# Patient Record
Sex: Female | Born: 1975 | Race: Black or African American | Hispanic: No | Marital: Single | Smoking: Current every day smoker
Health system: Southern US, Community
[De-identification: ages and names within clinical notes are randomized; demographics above are authoritative.]

## PROBLEM LIST (undated history)

## (undated) DIAGNOSIS — E119 Type 2 diabetes mellitus without complications: Secondary | ICD-10-CM

## (undated) HISTORY — PX: TONSILLECTOMY: SUR1361

---

## 2000-02-08 HISTORY — PX: TUBAL LIGATION: SHX77

## 2013-10-09 ENCOUNTER — Emergency Department: Payer: Self-pay | Admitting: Emergency Medicine

## 2013-10-09 LAB — CBC WITH DIFFERENTIAL/PLATELET
Basophil #: 0 x10 3/mm 3
Basophil %: 0.6 %
Eosinophil #: 0.2 x10 3/mm 3
Eosinophil %: 2.7 %
HCT: 39.6 %
HGB: 12.9 g/dL
Lymphocyte %: 35.2 %
Lymphs Abs: 2.7 x10 3/mm 3
MCH: 29 pg
MCHC: 32.7 g/dL
MCV: 89 fL
Monocyte #: 0.6 "x10 3/mm "
Monocyte %: 7.8 %
Neutrophil #: 4.1 x10 3/mm 3
Neutrophil %: 53.7 %
Platelet: 215 x10 3/mm 3
RBC: 4.46 X10 6/mm 3
RDW: 12.7 %
WBC: 7.6 x10 3/mm 3

## 2013-10-09 LAB — CK TOTAL AND CKMB (NOT AT ARMC)
CK, Total: 142 U/L
CK-MB: 1.2 ng/mL (ref 0.5–3.6)

## 2013-10-09 LAB — COMPREHENSIVE METABOLIC PANEL WITH GFR
Albumin: 3.2 g/dL — ABNORMAL LOW
Alkaline Phosphatase: 61 U/L
Anion Gap: 4 — ABNORMAL LOW
BUN: 9 mg/dL
Bilirubin,Total: 0.8 mg/dL
Calcium, Total: 7.8 mg/dL — ABNORMAL LOW
Chloride: 106 mmol/L
Co2: 28 mmol/L
Creatinine: 0.86 mg/dL
EGFR (African American): >60
EGFR (Non-African Amer.): >60
Glucose: 154 mg/dL — ABNORMAL HIGH
Osmolality: 277
Potassium: 3.7 mmol/L
SGOT(AST): 17 U/L
SGPT (ALT): 15 U/L
Sodium: 138 mmol/L
Total Protein: 6.9 g/dL

## 2013-10-09 LAB — TROPONIN I: Troponin-I: <0.02 ng/mL

## 2013-10-09 LAB — TSH: Thyroid Stimulating Horm: 1.3 u[IU]/mL

## 2014-09-18 ENCOUNTER — Encounter: Payer: Self-pay | Admitting: Emergency Medicine

## 2014-09-18 ENCOUNTER — Emergency Department
Admission: EM | Admit: 2014-09-18 | Discharge: 2014-09-18 | Disposition: A | Discharge status: Home / Self Care | Payer: Self-pay | Attending: Emergency Medicine | Admitting: Emergency Medicine

## 2014-09-18 DIAGNOSIS — L02415 Cutaneous abscess of right lower limb: Secondary | ICD-10-CM

## 2014-09-18 DIAGNOSIS — Z720 Tobacco use: Secondary | ICD-10-CM | POA: Insufficient documentation

## 2014-09-18 DIAGNOSIS — L02413 Cutaneous abscess of right upper limb: Secondary | ICD-10-CM | POA: Insufficient documentation

## 2014-09-18 MED ORDER — OXYCODONE-ACETAMINOPHEN 5-325 MG PO TABS: 1.0000 | ORAL_TABLET | ORAL | Status: DC | PRN

## 2014-09-18 MED ORDER — OXYCODONE-ACETAMINOPHEN 5-325 MG PO TABS
2.0000 | ORAL_TABLET | Freq: Once | ORAL | Status: AC
  Administered 2014-09-18: 2 via ORAL
  Filled 2014-09-18: qty 2

## 2014-09-18 MED ORDER — SULFAMETHOXAZOLE-TRIMETHOPRIM 800-160 MG PO TABS: 1.0000 | ORAL_TABLET | Freq: Two times a day (BID) | ORAL | Status: DC

## 2014-09-18 NOTE — Discharge Instructions (Signed)
Abscess °An abscess (boil or furuncle) is an infected area on or under the skin. This area is filled with yellowish-white fluid (pus) and other material (debris). °HOME CARE  °· Only take medicines as told by your doctor. °· If you were given antibiotic medicine, take it as directed. Finish the medicine even if you start to feel better. °· If gauze is used, follow your doctor's directions for changing the gauze. °· To avoid spreading the infection: °¨ Keep your abscess covered with a bandage. °¨ Wash your hands well. °¨ Do not share personal care items, towels, or whirlpools with others. °¨ Avoid skin contact with others. °· Keep your skin and clothes clean around the abscess. °· Keep all doctor visits as told. °GET HELP RIGHT AWAY IF:  °· You have more pain, puffiness (swelling), or redness in the wound site. °· You have more fluid or blood coming from the wound site. °· You have muscle aches, chills, or you feel sick. °· You have a fever. °MAKE SURE YOU:  °· Understand these instructions. °· Will watch your condition. °· Will get help right away if you are not doing well or get worse. °Document Released: 07/13/2007 Document Revised: 07/26/2011 Document Reviewed: 04/08/2011 °ExitCare® Patient Information ©2015 [employer]. This information is not intended to replace advice given to you by your health care provider. Make sure you discuss any questions you have with your health care provider. ° °

## 2014-09-18 NOTE — ED Notes (Signed)
Patient to ED with c/o abscess to right inner thigh, reports has been there since Monday.

## 2014-09-18 NOTE — ED Provider Notes (Signed)
[facility] Emergency Department Provider Note ____________________________________________  Time seen: Approximately 9:46 AM  I have reviewed the triage vital signs and the nursing notes.   HISTORY  Chief Complaint Abscess   HPI Sandra Perkins is a 39 y.o. female is here with complaint of abscess to her right inner thigh for approximately 3 days. Patient has been unaware of any fever but did have some chills last night. Patient says it was cool in the house but patient didn't actually have a temperature. Patient has had problems with abscesses in the past. She has not taken any medication for this over-the-counter. She states her pain is 10 out of 10. Walking and touching it makes the pain worse.   History reviewed. No pertinent past medical history.  There are no active problems to display for this patient.   History reviewed. No pertinent past surgical history.  Current Outpatient Rx  Name  Route  Sig  Dispense  Refill  . oxyCODONE-acetaminophen (PERCOCET) 5-325 MG per tablet   Oral   Take 1 tablet by mouth every 4 (four) hours as needed for severe pain.   20 tablet   0   . sulfamethoxazole-trimethoprim (BACTRIM DS,SEPTRA DS) 800-160 MG per tablet   Oral   Take 1 tablet by mouth 2 (two) times daily.   20 tablet   0     Allergies Review of patient's allergies indicates no known allergies.  History reviewed. No pertinent family history.  Social History Social History  Substance Use Topics  . Smoking status: Current Every Day Smoker  . Smokeless tobacco: None  . Alcohol Use: No    Review of Systems Constitutional: No fever/positive chills Cardiovascular: Denies chest pain. Respiratory: Denies shortness of breath. Gastrointestinal: No abdominal pain.  No nausea, no vomiting.   Genitourinary: Negative for dysuria. Musculoskeletal: Negative for back pain. Skin: Negative for rash. History of abscesses Neurological: Negative for  headaches, focal weakness or numbness.  10-point ROS otherwise negative.  ____________________________________________   PHYSICAL EXAM:  VITAL SIGNS: ED Triage Vitals  Enc Vitals Group     BP 09/18/14 0854 138/64 mmHg     Pulse Rate 09/18/14 0854 95     Resp --      Temp 09/18/14 0854 98.8 F (37.1 C)     Temp Source 09/18/14 0854 Oral     SpO2 09/18/14 0854 93 %     Weight 09/18/14 0854 290 lb (131.543 kg)     Height 09/18/14 0854 5\' 10"  (1.778 m)     Head Cir --      Peak Flow --      Pain Score --      Pain Loc --      Pain Edu? --      Excl. in GC? --     Constitutional: Alert and oriented. Well appearing and in no acute distress. Eyes: Conjunctivae are normal. PERRL. EOMI. Head: Atraumatic. Nose: No congestion/rhinnorhea. Neck: No stridor.   Cardiovascular: Normal rate, regular rhythm. Grossly normal heart sounds.  Good peripheral circulation. Respiratory: Normal respiratory effort.  No retractions. Lungs CTAB. Gastrointestinal: Soft and nontender. No distention. Musculoskeletal: No lower extremity tenderness nor edema.  No joint effusions. Neurologic:  Normal speech and language. No gross focal neurologic deficits are appreciated. No gait instability. Skin:  Skin is warm, dry and intact. No rash noted. Right inner thigh with large very firm erythematous area measuring approximately 8 cm. There is no fluctuance noted in the entire area. Psychiatric:  Mood and affect are normal. Speech and behavior are normal.  ____________________________________________   LABS (all labs ordered are listed, but only abnormal results are displayed)  Labs Reviewed - No data to display  PROCEDURES  Procedure(s) performed: None  Critical Care performed: No  ____________________________________________   INITIAL IMPRESSION / ASSESSMENT AND PLAN / ED COURSE  Pertinent labs & imaging results that were available during my care of the patient were reviewed by me and considered  in my medical decision making (see chart for details).  Patient was told to begin use warm compresses to the area. She was started on Septra DS and given Percocet for pain. She is return in 24 hours if there is a fluctuant area as we discussed for I&D. ____________________________________________   FINAL CLINICAL IMPRESSION(S) / ED DIAGNOSES  Final diagnoses:  Abscess of right thigh      Tommi Rumps, PA-C 09/18/14 1415  Darien Ramus, MD 09/18/14 1540

## 2014-09-24 ENCOUNTER — Encounter: Payer: Self-pay | Admitting: Emergency Medicine

## 2014-09-24 ENCOUNTER — Emergency Department
Admission: EM | Admit: 2014-09-24 | Discharge: 2014-09-24 | Disposition: A | Discharge status: Home / Self Care | Payer: Self-pay | Attending: Student | Admitting: Student

## 2014-09-24 DIAGNOSIS — Z720 Tobacco use: Secondary | ICD-10-CM | POA: Insufficient documentation

## 2014-09-24 DIAGNOSIS — Z79899 Other long term (current) drug therapy: Secondary | ICD-10-CM | POA: Insufficient documentation

## 2014-09-24 DIAGNOSIS — L02415 Cutaneous abscess of right lower limb: Secondary | ICD-10-CM | POA: Insufficient documentation

## 2014-09-24 MED ORDER — LIDOCAINE-EPINEPHRINE (PF) 1 %-1:200000 IJ SOLN
INTRAMUSCULAR | Status: AC
  Administered 2014-09-24: 30 mL
  Filled 2014-09-24: qty 30

## 2014-09-24 MED ORDER — LIDOCAINE-EPINEPHRINE 2 %-1:100000 IJ SOLN
30.0000 mL | Freq: Once | INTRAMUSCULAR | Status: DC
  Filled 2014-09-24: qty 30

## 2014-09-24 MED ORDER — OXYCODONE-ACETAMINOPHEN 7.5-325 MG PO TABS: 1.0000 | ORAL_TABLET | Freq: Four times a day (QID) | ORAL | Status: DC | PRN

## 2014-09-24 NOTE — Discharge Instructions (Signed)

## 2014-09-24 NOTE — ED Notes (Signed)
Pt to ed with c/o ? Abscess to right upper thigh, groin area.  Pt states started last Monday, tried OTC,  States she was seen here on Thursday for same, told to use hot baths and heating pad, reports very minimal drainage from area starting yesterday,  Area is hard to touch, large area approx 5 inches wide.

## 2014-09-24 NOTE — ED Provider Notes (Signed)
[facility] Emergency Department Provider Note  ____________________________________________  Time seen: Approximately 9:25 AM  I have reviewed the triage vital signs and the nursing notes.   HISTORY  Chief Complaint Abscess   HPI Sandra Perkins is a 39 y.o. female presents to the ED with complaint of abscess to her right inner thigh.  She was seen last Thursday in the emergency room.  At that time, they did not attempt to drain the abscess but started her on Bactrim and Percocet.  She notes the abscess has started to drain spontaneously and therefore came in to have it drained.  She is still in a lot of pain.   History reviewed. No pertinent past medical history.  There are no active problems to display for this patient.   History reviewed. No pertinent past surgical history.  Current Outpatient Rx  Name  Route  Sig  Dispense  Refill  . oxyCODONE-acetaminophen (PERCOCET) 5-325 MG per tablet   Oral   Take 1 tablet by mouth every 4 (four) hours as needed for severe pain.   20 tablet   0   . oxyCODONE-acetaminophen (PERCOCET) 7.5-325 MG per tablet   Oral   Take 1 tablet by mouth every 6 (six) hours as needed for severe pain.   20 tablet   0   . sulfamethoxazole-trimethoprim (BACTRIM DS,SEPTRA DS) 800-160 MG per tablet   Oral   Take 1 tablet by mouth 2 (two) times daily.   20 tablet   0     Allergies Review of patient's allergies indicates no known allergies.  History reviewed. No pertinent family history.  Social History Social History  Substance Use Topics  . Smoking status: Current Every Day Smoker  . Smokeless tobacco: None  . Alcohol Use: No    Review of Systems Constitutional: No fever/chills Eyes: No visual changes. ENT: No sore throat. Cardiovascular: Denies chest pain. Respiratory: Denies shortness of breath. Gastrointestinal: No abdominal pain.  No nausea, no vomiting.  No diarrhea.  No constipation. Genitourinary:  Negative for dysuria. Musculoskeletal: Negative for back pain. Skin: Abscess with drainage to the right inner thigh. Neurological: Negative for headaches, focal weakness or numbness.  10-point ROS otherwise negative.  ____________________________________________   PHYSICAL EXAM:  VITAL SIGNS: ED Triage Vitals  Enc Vitals Group     BP --      Pulse --      Resp --      Temp --      Temp src --      SpO2 --      Weight --      Height --      Head Cir --      Peak Flow --      Pain Score --      Pain Loc --      Pain Edu? --      Excl. in GC? --     Constitutional: Alert and oriented. Well appearing and in no acute distress. Eyes: Conjunctivae are normal. PERRL. EOMI. Head: Atraumatic. Nose: No congestion/rhinnorhea. Mouth/Throat: Mucous membranes are moist.  Oropharynx non-erythematous. Neck: No stridor.   Cardiovascular: Normal rate, regular rhythm. Grossly normal heart sounds.  Good peripheral circulation. Respiratory: Normal respiratory effort.  No retractions. Lungs CTAB. Gastrointestinal: Soft and nontender. No distention. No abdominal bruits. No CVA tenderness. Musculoskeletal: No lower extremity tenderness nor edema.  No joint effusions. Neurologic:  Normal speech and language. No gross focal neurologic deficits are appreciated. No gait instability.  Skin:  Abscess to the right inner thigh.  Drainage noted on inspection.  Tender to palpation around the abscess. Large area of induration noted around drainage - 4-5 cm.  Psychiatric: Mood and affect are normal. Speech and behavior are normal.  ____________________________________________   LABS (all labs ordered are listed, but only abnormal results are displayed)  Labs Reviewed - No data to display ____________________________________________  EKG  None ____________________________________________  RADIOLOGY  None ____________________________________________   PROCEDURES  Procedure(s) performed:  Abscess I&D, see procedure note(s).   INCISION AND DRAINAGE Performed by: Joni Reining Consent: Verbal consent obtained. Risks and benefits: risks, benefits and alternatives were discussed Type: abscess  Body area: Right inner thigh  Anesthesia: local infiltration  Incision was made with a scalpel.  Local anesthetic: lidocaine 1% with epinephrine  Anesthetic total: 5 ml  Complexity: complex Blunt dissection to break up loculations  Drainage: purulent and bloody  Drainage amount: minimal  Packing material: 1/4 in iodoform gauze  Patient tolerance: Patient tolerated the procedure well with no immediate complications.    Critical Care performed: No  ____________________________________________   INITIAL IMPRESSION / ASSESSMENT AND PLAN / ED COURSE  Pertinent labs & imaging results that were available during my care of the patient were reviewed by me and considered in my medical decision making (see chart for details).  Patient presented with continued complaint of abscess.  Patient seen on 08/11 for same complaint.  Started on Bactrim and Percocet at that time.  Patient will continue Bactrim and a new prescription for Percocet was provided.  I&D performed but little to know material removed.  Area remains indurated.  She will be out of town until Sunday but she has been given and understands instructions on removal of packing material.  She is to return to the ER for a wound check once she is back in town.  Instructions given to seek medical attention if she has sudden onset of fever or complications with I&D site. ____________________________________________   FINAL CLINICAL IMPRESSION(S) / ED DIAGNOSES  Final diagnoses:  Abscess of right thigh      Joni Reining, PA-C 09/24/14 1034  Gayla Doss, MD 09/24/14 [phone]

## 2014-09-24 NOTE — ED Notes (Signed)
Pt with c/o abscess to right upper groin been there x 2 weeks.  Pt states mild drainage.  No redness noted, firmness noted around area.

## 2015-02-23 ENCOUNTER — Emergency Department
Admission: EM | Admit: 2015-02-23 | Discharge: 2015-02-23 | Disposition: A | Discharge status: Home / Self Care | Payer: Self-pay | Attending: Emergency Medicine | Admitting: Emergency Medicine

## 2015-02-23 ENCOUNTER — Encounter: Payer: Self-pay | Admitting: *Deleted

## 2015-02-23 DIAGNOSIS — F17200 Nicotine dependence, unspecified, uncomplicated: Secondary | ICD-10-CM | POA: Insufficient documentation

## 2015-02-23 DIAGNOSIS — N939 Abnormal uterine and vaginal bleeding, unspecified: Secondary | ICD-10-CM | POA: Insufficient documentation

## 2015-02-23 DIAGNOSIS — E119 Type 2 diabetes mellitus without complications: Secondary | ICD-10-CM | POA: Insufficient documentation

## 2015-02-23 DIAGNOSIS — Z3202 Encounter for pregnancy test, result negative: Secondary | ICD-10-CM | POA: Insufficient documentation

## 2015-02-23 LAB — WET PREP, GENITAL
Clue Cells Wet Prep HPF POC: NONE SEEN
SPERM: NONE SEEN
Trich, Wet Prep: NONE SEEN
YEAST WET PREP: NONE SEEN

## 2015-02-23 LAB — BASIC METABOLIC PANEL
Anion gap: 7 (ref 5–15)
BUN: 9 mg/dL (ref 6–20)
CHLORIDE: 99 mmol/L — AB (ref 101–111)
CO2: 25 mmol/L (ref 22–32)
CREATININE: 0.68 mg/dL (ref 0.44–1.00)
Calcium: 8.6 mg/dL — ABNORMAL LOW (ref 8.9–10.3)
GFR calc Af Amer: >60 mL/min (ref >60)
GFR calc non Af Amer: >60 mL/min (ref >60)
GLUCOSE: 413 mg/dL — AB (ref 65–99)
Potassium: 4 mmol/L (ref 3.5–5.1)
Sodium: 131 mmol/L — ABNORMAL LOW (ref 135–145)

## 2015-02-23 LAB — URINALYSIS COMPLETE WITH MICROSCOPIC (ARMC ONLY)
BACTERIA UA: NONE SEEN
BILIRUBIN URINE: NEGATIVE
Ketones, ur: NEGATIVE mg/dL
LEUKOCYTES UA: NEGATIVE
NITRITE: NEGATIVE
Protein, ur: NEGATIVE mg/dL
SPECIFIC GRAVITY, URINE: 1.038 — AB (ref 1.005–1.030)
pH: 5 (ref 5.0–8.0)

## 2015-02-23 LAB — CHLAMYDIA/NGC RT PCR (ARMC ONLY)
Chlamydia Tr: NOT DETECTED
N GONORRHOEAE: NOT DETECTED

## 2015-02-23 LAB — CBC
HEMATOCRIT: 38.4 % (ref 35.0–47.0)
Hemoglobin: 12.5 g/dL (ref 12.0–16.0)
MCH: 27.1 pg (ref 26.0–34.0)
MCHC: 32.5 g/dL (ref 32.0–36.0)
MCV: 83.2 fL (ref 80.0–100.0)
Platelets: 237 10*3/uL (ref 150–440)
RBC: 4.62 MIL/uL (ref 3.80–5.20)
RDW: 12.9 % (ref 11.5–14.5)
WBC: 6.3 10*3/uL (ref 3.6–11.0)

## 2015-02-23 LAB — PREGNANCY, URINE: Preg Test, Ur: NEGATIVE

## 2015-02-23 MED ORDER — METFORMIN HCL 500 MG PO TABS: 500.0000 mg | ORAL_TABLET | Freq: Two times a day (BID) | ORAL | Status: AC

## 2015-02-23 MED ORDER — MEDROXYPROGESTERONE ACETATE 10 MG PO TABS: 10.0000 mg | ORAL_TABLET | Freq: Every day | ORAL | Status: DC

## 2015-02-23 NOTE — Discharge Instructions (Signed)
Abnormal Uterine Bleeding Abnormal uterine bleeding can affect women at various stages in life, including teenagers, women in their reproductive years, pregnant women, and women who have reached menopause. Several kinds of uterine bleeding are considered abnormal, including:  Bleeding or spotting between periods.   Bleeding after sexual intercourse.   Bleeding that is heavier or more than normal.   Periods that last longer than usual.  Bleeding after menopause.  Many cases of abnormal uterine bleeding are minor and simple to treat, while others are more serious. Any type of abnormal bleeding should be evaluated by your health care provider. Treatment will depend on the cause of the bleeding. HOME CARE INSTRUCTIONS Monitor your condition for any changes. The following actions may help to alleviate any discomfort you are experiencing:  Avoid the use of tampons and douches as directed by your health care provider.  Change your pads frequently. You should get regular pelvic exams and Pap tests. Keep all follow-up appointments for diagnostic tests as directed by your health care provider.  SEEK MEDICAL CARE IF:   Your bleeding lasts more than 1 week.   You feel dizzy at times.  SEEK IMMEDIATE MEDICAL CARE IF:   You pass out.   You are changing pads every 15 to 30 minutes.   You have abdominal pain.  You have a fever.   You become sweaty or weak.   You are passing large blood clots from the vagina.   You start to feel nauseous and vomit. MAKE SURE YOU:   Understand these instructions.  Will watch your condition.  Will get help right away if you are not doing well or get worse.   This information is not intended to replace advice given to you by your health care provider. Make sure you discuss any questions you have with your health care provider.   Document Released: 01/24/2005 Document Revised: 01/29/2013 Document Reviewed: 08/23/2012 Elsevier Interactive  Patient Education 2016 [employer]  Hyperglycemia Hyperglycemia occurs when the glucose (sugar) in your blood is too high. Hyperglycemia can happen for many reasons, but it most often happens to people who do not know they have diabetes or are not managing their diabetes properly.  CAUSES  Whether you have diabetes or not, there are other causes of hyperglycemia. Hyperglycemia can occur when you have diabetes, but it can also occur in other situations that you might not be as aware of, such as: Diabetes  If you have diabetes and are having problems controlling your blood glucose, hyperglycemia could occur because of some of the following reasons:  Not following your meal plan.  Not taking your diabetes medications or not taking it properly.  Exercising less or doing less activity than you normally do.  Being sick. Pre-diabetes  This cannot be ignored. Before people develop Type 2 diabetes, they almost always have "pre-diabetes." This is when your blood glucose levels are higher than normal, but not yet high enough to be diagnosed as diabetes. Research has shown that some long-term damage to the body, especially the heart and circulatory system, may already be occurring during pre-diabetes. If you take action to manage your blood glucose when you have pre-diabetes, you may delay or prevent Type 2 diabetes from developing. Stress  If you have diabetes, you may be "diet" controlled or on oral medications or insulin to control your diabetes. However, you may find that your blood glucose is higher than usual in the hospital whether you have diabetes or not. This is often referred  to as "stress hyperglycemia." Stress can elevate your blood glucose. This happens because of hormones put out by the body during times of stress. If stress has been the cause of your high blood glucose, it can be followed regularly by your caregiver. That way he/she can make sure your hyperglycemia does not continue to  get worse or progress to diabetes. Steroids  Steroids are medications that act on the infection fighting system (immune system) to block inflammation or infection. One side effect can be a rise in blood glucose. Most people can produce enough extra insulin to allow for this rise, but for those who cannot, steroids make blood glucose levels go even higher. It is not unusual for steroid treatments to "uncover" diabetes that is developing. It is not always possible to determine if the hyperglycemia will go away after the steroids are stopped. A special blood test called an A1c is sometimes done to determine if your blood glucose was elevated before the steroids were started. SYMPTOMS  Thirsty.  Frequent urination.  Dry mouth.  Blurred vision.  Tired or fatigue.  Weakness.  Sleepy.  Tingling in feet or leg. DIAGNOSIS  Diagnosis is made by monitoring blood glucose in one or all of the following ways:  A1c test. This is a chemical found in your blood.  Fingerstick blood glucose monitoring.  Laboratory results. TREATMENT  First, knowing the cause of the hyperglycemia is important before the hyperglycemia can be treated. Treatment may include, but is not be limited to:  Education.  Change or adjustment in medications.  Change or adjustment in meal plan.  Treatment for an illness, infection, etc.  More frequent blood glucose monitoring.  Change in exercise plan.  Decreasing or stopping steroids.  Lifestyle changes. HOME CARE INSTRUCTIONS   Test your blood glucose as directed.  Exercise regularly. Your caregiver will give you instructions about exercise. Pre-diabetes or diabetes which comes on with stress is helped by exercising.  Eat wholesome, balanced meals. Eat often and at regular, fixed times. Your caregiver or nutritionist will give you a meal plan to guide your sugar intake.  Being at an ideal weight is important. If needed, losing as little as 10 to 15 pounds may  help improve blood glucose levels. SEEK MEDICAL CARE IF:   You have questions about medicine, activity, or diet.  You continue to have symptoms (problems such as increased thirst, urination, or weight gain). SEEK IMMEDIATE MEDICAL CARE IF:   You are vomiting or have diarrhea.  Your breath smells fruity.  You are breathing faster or slower.  You are very sleepy or incoherent.  You have numbness, tingling, or pain in your feet or hands.  You have chest pain.  Your symptoms get worse even though you have been following your caregiver's orders.  If you have any other questions or concerns.   This information is not intended to replace advice given to you by your health care provider. Make sure you discuss any questions you have with your health care provider.   Document Released: 07/20/2000 Document Revised: 04/18/2011 Document Reviewed: 09/30/2014 Elsevier Interactive Patient Education [employer]

## 2015-02-23 NOTE — ED Notes (Addendum)
States vaginal bleeding since Friday, pt states she is on her cycle and states she normally has bad periods but this time she is passing clots and feels weak, states urinary frequency

## 2015-02-23 NOTE — ED Provider Notes (Signed)
[facility] Emergency Department Provider Note     Time seen: ----------------------------------------- 5:45 PM on 02/23/2015 -----------------------------------------    I have reviewed the triage vital signs and the nursing notes.   HISTORY  Chief Complaint Vaginal Bleeding    HPI Sandra Perkins is a 40 y.o. female who presents to ER for menstrual cramping and vaginal bleeding. Patient states she is currently on her menstrual cycle and that she is bleeding more heavily than normal. Patient states she usually has cramping and passes clots but this is worse and she is also feeling weak. The makes her symptoms better.   History reviewed. No pertinent past medical history.  There are no active problems to display for this patient.   History reviewed. No pertinent past surgical history.  Allergies Review of patient's allergies indicates no known allergies.  Social History Social History  Substance Use Topics  . Smoking status: Current Every Day Smoker  . Smokeless tobacco: None  . Alcohol Use: No    Review of Systems Constitutional: Negative for fever. Eyes: Negative for visual changes. ENT: Negative for sore throat. Cardiovascular: Negative for chest pain. Respiratory: Negative for shortness of breath. Gastrointestinal: Positive for pelvic cramping Genitourinary: Negative for dysuria. Positive for vaginal bleeding Musculoskeletal: Negative for back pain. Skin: Negative for rash. Neurological: Negative for headaches, positive for weakness  10-point ROS otherwise negative.  ____________________________________________   PHYSICAL EXAM:  VITAL SIGNS: ED Triage Vitals  Enc Vitals Group     BP 02/23/15 1600 127/87 mmHg     Pulse Rate 02/23/15 1600 88     Resp 02/23/15 1600 18     Temp 02/23/15 1600 99.1 F (37.3 C)     Temp Source 02/23/15 1600 Oral     SpO2 02/23/15 1600 99 %     Weight 02/23/15 1600 320 lb (145.151 kg)   Height 02/23/15 1600 5\' 10"  (1.778 m)     Head Cir --      Peak Flow --      Pain Score 02/23/15 1600 5     Pain Loc --      Pain Edu? --      Excl. in GC? --     Constitutional: Alert and oriented. Well appearing and in no distress. Eyes: Conjunctivae are normal. PERRL. Normal extraocular movements. ENT   Head: Normocephalic and atraumatic.   Nose: No congestion/rhinnorhea.   Mouth/Throat: Mucous membranes are moist.   Neck: No stridor. Cardiovascular: Normal rate, regular rhythm. Normal and symmetric distal pulses are present in all extremities. No murmurs, rubs, or gallops. Respiratory: Normal respiratory effort without tachypnea nor retractions. Breath sounds are clear and equal bilaterally. No wheezes/rales/rhonchi. Gastrointestinal: Soft and nontender. No distention. No abdominal bruits.  Musculoskeletal: Nontender with normal range of motion in all extremities. No joint effusions.  No lower extremity tenderness nor edema. Pelvic: Light vaginal bleeding, normal-appearing cervix, no discharge, nontender Neurologic:  Normal speech and language. No gross focal neurologic deficits are appreciated. Speech is normal. No gait instability. Skin:  Skin is warm, dry and intact. No rash noted. Psychiatric: Mood and affect are normal. Speech and behavior are normal. Patient exhibits appropriate insight and judgment. ____________________________________________  ED COURSE:  Pertinent labs & imaging results that were available during my care of the patient were reviewed by me and considered in my medical decision making (see chart for details). Patient is no acute distress, will check basic labs and reevaluate. ____________________________________________    LABS (pertinent positives/negatives)  Labs  Reviewed  WET PREP, GENITAL - Abnormal; Notable for the following:    WBC, Wet Prep HPF POC FEW (*)    All other components within normal limits  URINALYSIS COMPLETEWITH  MICROSCOPIC ([facility] ONLY) - Abnormal; Notable for the following:    Color, Urine YELLOW (*)    APPearance CLEAR (*)    Glucose, UA >500 (*)    Specific Gravity, Urine 1.038 (*)    Hgb urine dipstick 3+ (*)    Squamous Epithelial / LPF 0-5 (*)    All other components within normal limits  BASIC METABOLIC PANEL - Abnormal; Notable for the following:    Sodium 131 (*)    Chloride 99 (*)    Glucose, Bld 413 (*)    Calcium 8.6 (*)    All other components within normal limits  CHLAMYDIA/NGC RT PCR ([facility] ONLY)  CBC  PREGNANCY, URINE  POC URINE PREG, ED   ____________________________________________  FINAL ASSESSMENT AND PLAN  Abnormal vaginal bleeding, diabetes type 2  Plan: Patient with labs and imaging as dictated above. I performed a pelvic exam due to the patient's request, she was concerned she may have an STD. This was not evident clinically. It was discovered that she had markedly hyperglycemia and glycosuria consistent with diabetes. Also start her on Glucophage, she also be on Provera to stop her bleeding. She is advised to have close follow-up with her doctor for reevaluation.   Emily FilbertWilliams, Quynh Basso E, MD   Emily FilbertJonathan E Marnee Sherrard, MD 02/23/15 [phone]

## 2015-02-23 NOTE — ED Notes (Signed)
States she is currently on her menes and is having abd cramping .Marland Kitchen. States she usually has cramping and passes clots but these cramps are worse and feels like her clots are larger  Also feels weak

## 2015-02-25 ENCOUNTER — Emergency Department
Admission: EM | Admit: 2015-02-25 | Discharge: 2015-02-25 | Disposition: A | Discharge status: Home / Self Care | Payer: Self-pay | Attending: Emergency Medicine | Admitting: Emergency Medicine

## 2015-02-25 ENCOUNTER — Encounter: Payer: Self-pay | Admitting: Emergency Medicine

## 2015-02-25 DIAGNOSIS — Z7984 Long term (current) use of oral hypoglycemic drugs: Secondary | ICD-10-CM | POA: Insufficient documentation

## 2015-02-25 DIAGNOSIS — E1165 Type 2 diabetes mellitus with hyperglycemia: Secondary | ICD-10-CM | POA: Insufficient documentation

## 2015-02-25 DIAGNOSIS — E119 Type 2 diabetes mellitus without complications: Secondary | ICD-10-CM

## 2015-02-25 DIAGNOSIS — Z79899 Other long term (current) drug therapy: Secondary | ICD-10-CM | POA: Insufficient documentation

## 2015-02-25 DIAGNOSIS — F17200 Nicotine dependence, unspecified, uncomplicated: Secondary | ICD-10-CM | POA: Insufficient documentation

## 2015-02-25 HISTORY — DX: Type 2 diabetes mellitus without complications: E11.9

## 2015-02-25 LAB — GLUCOSE, CAPILLARY
GLUCOSE-CAPILLARY: 437 mg/dL — AB (ref 65–99)
GLUCOSE-CAPILLARY: 465 mg/dL — AB (ref 65–99)
GLUCOSE-CAPILLARY: 355 mg/dL — AB (ref 65–99)
Glucose-Capillary: 379 mg/dL — ABNORMAL HIGH (ref 65–99)
Glucose-Capillary: 350 mg/dL — ABNORMAL HIGH (ref 65–99)

## 2015-02-25 LAB — URINALYSIS COMPLETE WITH MICROSCOPIC (ARMC ONLY)
Bilirubin Urine: NEGATIVE
NITRITE: NEGATIVE
Protein, ur: NEGATIVE mg/dL
Specific Gravity, Urine: 1.033 — ABNORMAL HIGH (ref 1.005–1.030)
pH: 5 (ref 5.0–8.0)

## 2015-02-25 LAB — COMPREHENSIVE METABOLIC PANEL
ALK PHOS: 324 U/L — AB (ref 38–126)
ALT: 232 U/L — ABNORMAL HIGH (ref 14–54)
ANION GAP: 7 (ref 5–15)
AST: 113 U/L — ABNORMAL HIGH (ref 15–41)
Albumin: 3.4 g/dL — ABNORMAL LOW (ref 3.5–5.0)
BILIRUBIN TOTAL: 1.4 mg/dL — AB (ref 0.3–1.2)
BUN: 10 mg/dL (ref 6–20)
CALCIUM: 8.6 mg/dL — AB (ref 8.9–10.3)
CO2: 24 mmol/L (ref 22–32)
CREATININE: 0.65 mg/dL (ref 0.44–1.00)
Chloride: 100 mmol/L — ABNORMAL LOW (ref 101–111)
GFR calc Af Amer: >60 mL/min (ref >60)
GFR calc non Af Amer: >60 mL/min (ref >60)
Glucose, Bld: 457 mg/dL — ABNORMAL HIGH (ref 65–99)
POTASSIUM: 4 mmol/L (ref 3.5–5.1)
Sodium: 131 mmol/L — ABNORMAL LOW (ref 135–145)
Total Protein: 7.8 g/dL (ref 6.5–8.1)

## 2015-02-25 LAB — CBC WITH DIFFERENTIAL/PLATELET
BASOS PCT: 1 %
Basophils Absolute: 0.1 10*3/uL (ref 0–0.1)
Eosinophils Absolute: 0.2 10*3/uL (ref 0–0.7)
Eosinophils Relative: 3 %
HCT: 38.4 % (ref 35.0–47.0)
Hemoglobin: 12.7 g/dL (ref 12.0–16.0)
Lymphocytes Relative: 18 %
Lymphs Abs: 1.5 10*3/uL (ref 1.0–3.6)
MCH: 27.9 pg (ref 26.0–34.0)
MCHC: 33.1 g/dL (ref 32.0–36.0)
MCV: 84.1 fL (ref 80.0–100.0)
Monocytes Absolute: 0.7 10*3/uL (ref 0.2–0.9)
Monocytes Relative: 9 %
Neutro Abs: 5.8 10*3/uL (ref 1.4–6.5)
Neutrophils Relative %: 69 %
Platelets: 227 10*3/uL (ref 150–440)
RBC: 4.57 MIL/uL (ref 3.80–5.20)
RDW: 12.6 % (ref 11.5–14.5)
WBC: 8.3 10*3/uL (ref 3.6–11.0)

## 2015-02-25 MED ORDER — GLIPIZIDE 10 MG PO TABS
5.0000 mg | ORAL_TABLET | Freq: Every day | ORAL | Status: DC
  Administered 2015-02-25: 5 mg via ORAL

## 2015-02-25 MED ORDER — LIVING WELL WITH DIABETES BOOK
Freq: Once | Status: AC
  Administered 2015-02-25: 12:00:00
  Filled 2015-02-25: qty 1

## 2015-02-25 MED ORDER — INSULIN ASPART 100 UNIT/ML ~~LOC~~ SOLN
5.0000 [IU] | Freq: Once | SUBCUTANEOUS | Status: AC
  Administered 2015-02-25: 5 [IU] via SUBCUTANEOUS
  Filled 2015-02-25: qty 5

## 2015-02-25 MED ORDER — CEPHALEXIN 500 MG PO CAPS: 500.0000 mg | ORAL_CAPSULE | Freq: Four times a day (QID) | ORAL | Status: AC

## 2015-02-25 MED ORDER — CEPHALEXIN 500 MG PO CAPS
1000.0000 mg | ORAL_CAPSULE | Freq: Once | ORAL | Status: AC
  Administered 2015-02-25: 1000 mg via ORAL
  Filled 2015-02-25: qty 2

## 2015-02-25 MED ORDER — GLIPIZIDE 10 MG PO TABS
ORAL_TABLET | ORAL | Status: AC
  Administered 2015-02-25: 5 mg via ORAL
  Filled 2015-02-25: qty 1

## 2015-02-25 MED ORDER — INSULIN ASPART 100 UNIT/ML ~~LOC~~ SOLN
5.0000 [IU] | Freq: Once | SUBCUTANEOUS | Status: AC
  Administered 2015-02-25: 5 [IU] via INTRAVENOUS
  Filled 2015-02-25: qty 5

## 2015-02-25 MED ORDER — SODIUM CHLORIDE 0.9 % IV SOLN
Freq: Once | INTRAVENOUS | Status: AC
  Administered 2015-02-25: 14:00:00 via INTRAVENOUS

## 2015-02-25 MED ORDER — SODIUM CHLORIDE 0.9 % IV SOLN
Freq: Once | INTRAVENOUS | Status: AC
  Administered 2015-02-25: 11:00:00 via INTRAVENOUS

## 2015-02-25 MED ORDER — GLIPIZIDE 5 MG PO TABS: 5.0000 mg | ORAL_TABLET | Freq: Every day | ORAL | Status: DC

## 2015-02-25 NOTE — ED Notes (Signed)
Pt newly dx with diabetes last week and started metformin.  Reports still feeling lethargic and having urinary frequency.  Skin w/d with good color.

## 2015-02-25 NOTE — Progress Notes (Signed)
Results for [name], [name] (MRN [id number]) as of 02/25/2015 11:45  Ref. Range 02/25/2015 10:01 02/25/2015 10:01  Glucose-Capillary Latest Ref Range: 65-99 mg/dL 409 (H) 811 (H)   Patient is currently in the ED; noted initial glucose of 465 mg/dl and 914 mg/dl today. In reviewing the chart, noted patient was diagnosed with diabetes on 02/23/15 in the ED and was discharged on Metformin. Patient returned to ED with feeling lethargic and urinary frequency. Ordered Living Well With Diabetes booklet and nursing care order for diabetes education. NURSING: Please use each patient interaction to educate patient about diabetes. If patient does not have a PCP, please consult Case Management for assistance with establishing follow up care.  Thanks, Orlando Penner, RN, MSN, CDE Diabetes Coordinator Inpatient Diabetes Program [phone] (Team Pager from 8am to 5pm) [phone] ([facility] office) [phone] [facility] office) [phone] [facility] office)

## 2015-02-25 NOTE — ED Provider Notes (Signed)
[facility] Emergency Department Provider Note  ____________________________________________  Time seen: Approximately 3:07 PM  I have reviewed the triage vital signs and the nursing notes.   HISTORY  Chief Complaint Hyperglycemia and Urinary Frequency   HPI Sandra Perkins is a 40 y.o. female who was seen here 2 days ago with new onset diabetes referred to [facility] clinic. [facility] sent her here after they determined her  blood sugar to be over 400. Patient has been taking the metformin she started 500 mg twice a day but she is only taking for 2 days. patient reports she has a raw perineum which she gets sometimes from using 2 pads at once when she has her menstrual periods. She finished her menstrual period on Monday. Patient also has a little bit of burning when she urinates but reports this is on the outside only.   Past Medical History  Diagnosis Date  . Diabetes mellitus without complication (HCC)     There are no active problems to display for this patient.   Past Surgical History  Procedure Laterality Date  . Tubal ligation  2002  . Tonsillectomy      Current Outpatient Rx  Name  Route  Sig  Dispense  Refill  . medroxyPROGESTERone (PROVERA) 10 MG tablet   Oral   Take 1 tablet (10 mg total) by mouth daily.   10 tablet   0   . metFORMIN (GLUCOPHAGE) 500 MG tablet   Oral   Take 1 tablet (500 mg total) by mouth 2 (two) times daily with a meal.   60 tablet   11   . cephALEXin (KEFLEX) 500 MG capsule   Oral   Take 1 capsule (500 mg total) by mouth 4 (four) times daily.   40 capsule   0   . glipiZIDE (GLUCOTROL) 5 MG tablet   Oral   Take 1 tablet (5 mg total) by mouth daily.   30 tablet   11   . oxyCODONE-acetaminophen (PERCOCET) 5-325 MG per tablet   Oral   Take 1 tablet by mouth every 4 (four) hours as needed for severe pain. Patient not taking: Reported on 02/25/2015   20 tablet   0   . oxyCODONE-acetaminophen  (PERCOCET) 7.5-325 MG per tablet   Oral   Take 1 tablet by mouth every 6 (six) hours as needed for severe pain. Patient not taking: Reported on 02/25/2015   20 tablet   0     Allergies Review of patient's allergies indicates no known allergies.  History reviewed. No pertinent family history.  Social History Social History  Substance Use Topics  . Smoking status: Current Every Day Smoker  . Smokeless tobacco: None  . Alcohol Use: No    Review of Systems Constitutional: No fever/chills Eyes: No visual changes. ENT: No sore throat. Cardiovascular: Denies chest pain. Respiratory: Denies shortness of breath. Gastrointestinal: No abdominal pain.  No nausea, no vomiting.  No diarrhea.  No constipation. Genitourinary: Negative for dysuria. Musculoskeletal: Negative for back pain. Skin: Negative for rash. Neurological: Negative for headaches, focal weakness or numbness.  10-point ROS otherwise negative.  ____________________________________________   PHYSICAL EXAM:  VITAL SIGNS: ED Triage Vitals  Enc Vitals Group     BP 02/25/15 0959 136/83 mmHg     Pulse Rate 02/25/15 0959 101     Resp 02/25/15 0959 18     Temp 02/25/15 0959 98.4 F (36.9 C)     Temp Source 02/25/15 0959 Oral  SpO2 02/25/15 0959 98 %     Weight 02/25/15 0959 320 lb (145.151 kg)     Height 02/25/15 0959  (1.778 m)     Head Cir --      Peak Flow --      Pain Score --      Pain Loc --      Pain Edu? --      Excl. in GC? --     Constitutional: Alert and oriented. Well appearing and in no acute distress. Eyes: Conjunctivae are normal. PERRL. EOMI. Head: Atraumatic. Nose: No congestion/rhinnorhea. Mouth/Throat: Mucous membranes are moist.  Oropharynx non-erythematous. Neck: No stridor. Cardiovascular: Normal rate, regular rhythm. Grossly normal heart sounds.  Good peripheral circulation. Respiratory: Normal respiratory effort.  No retractions. Lungs CTAB. Gastrointestinal: Soft and  nontender. No distention. No abdominal bruits. No CVA tenderness. Musculoskeletal: No lower extremity tenderness nor edema.  No joint effusions. Neurologic:  Normal speech and language. No gross focal neurologic deficits are appreciated. No gait instability. Skin:  Skin is warm, dry and intact. No rash noted. Psychiatric: Mood and affect are normal. Speech and behavior are normal.  ____________________________________________   LABS (all labs ordered are listed, but only abnormal results are displayed)  Labs Reviewed  GLUCOSE, CAPILLARY - Abnormal; Notable for the following:    Glucose-Capillary 465 (*)    All other components within normal limits  GLUCOSE, CAPILLARY - Abnormal; Notable for the following:    Glucose-Capillary 437 (*)    All other components within normal limits  COMPREHENSIVE METABOLIC PANEL - Abnormal; Notable for the following:    Sodium 131 (*)    Chloride 100 (*)    Glucose, Bld 457 (*)    Calcium 8.6 (*)    Albumin 3.4 (*)    AST 113 (*)    ALT 232 (*)    Alkaline Phosphatase 324 (*)    Total Bilirubin 1.4 (*)    All other components within normal limits  URINALYSIS COMPLETEWITH MICROSCOPIC ([facility] ONLY) - Abnormal; Notable for the following:    Color, Urine YELLOW (*)    APPearance CLEAR (*)    Glucose, UA >500 (*)    Ketones, ur TRACE (*)    Specific Gravity, Urine 1.033 (*)    Hgb urine dipstick 3+ (*)    Leukocytes, UA TRACE (*)    Bacteria, UA RARE (*)    Squamous Epithelial / LPF 0-5 (*)    All other components within normal limits  GLUCOSE, CAPILLARY - Abnormal; Notable for the following:    Glucose-Capillary 379 (*)    All other components within normal limits  GLUCOSE, CAPILLARY - Abnormal; Notable for the following:    Glucose-Capillary 355 (*)    All other components within normal limits  GLUCOSE, CAPILLARY - Abnormal; Notable for the following:    Glucose-Capillary 350 (*)    All other components within normal limits  CBC WITH  DIFFERENTIAL/PLATELET   ____________________________________________  EKG   ____________________________________________  RADIOLOGY   ____________________________________________   PROCEDURES    ____________________________________________   INITIAL IMPRESSION / ASSESSMENT AND PLAN / ED COURSE  Pertinent labs & imaging results that were available during my care of the patient were reviewed by me and considered in my medical decision making (see chart for details).   ____________________________________________   FINAL CLINICAL IMPRESSION(S) / ED DIAGNOSES  Final diagnoses:  New onset type 2 diabetes mellitus (HCC)      Arnaldo Natal, MD 02/25/15 [id number]

## 2015-02-25 NOTE — Discharge Instructions (Signed)
Complementary and Alternative Medical Therapies for Diabetes Complementary and alternative medicines are health care practices or products that are not always accepted as part of routine medicine. Complementary medicine is used along with routine medicine (medical therapy). Alternative medicine can sometimes be used instead of routine medicine. Some people use these methods to treat diabetes. While some of these therapies may be effective, others may not be. Some may even be harmful. Patients using these methods need to tell their caregiver. It is important to let your caregivers know what you are doing. Some of these therapies are discussed below. For more information, talk with your caregiver. THERAPIES Acupuncture Acupuncture is done by a professional who inserts needles into certain points on the skin. Some scientists believe that this triggers the release of the body's natural painkillers. It has been shown to relieve long-term (chronic) pain. This may help patients with painful nerve damage caused by diabetes. Biofeedback Biofeedback helps a person become more aware of the body's response to pain. It also helps you learn to deal with the pain. This alternative therapy focuses on relaxation and stress-reduction techniques. Thinking of peaceful mental images (guided imagery) is one technique. Some people believe these images can ease their condition. MEDICATIONS Chromium Several studies report that chromium supplements may improve diabetes control. Chromium helps insulin improve its action. Research is not yet certain. Supplements have not been recommended or approved. Caution is needed if you have kidney (renal) problems. Ginseng There are several types of ginseng plants. American ginseng is used for diabetes studies. Those studies have shown some glucose-lowering effects. Those effects have been seen with fasting and after-meal blood glucose levels. They have also been seen in A1c levels (average  blood glucose levels over a 12-month period). More long-term studies are needed before recommendations for use of ginseng can be made. Magnesium Experts have studied the relationship between magnesium and diabetes for many years. But it is not yet fully understood. Studies suggest that a low amount of magnesium may make blood glucose control worse in type 2 diabetes. Research also shows that a low amount may contribute to certain diabetes complications. One study showed that people who consume more magnesium had less risk of type 2 diabetes. Eating whole grains, nuts, and green leafy vegetables raises the magnesium level. Vanadium Vanadium is a compound found in tiny amounts in plants and animals. Early studies showed that vanadium improved blood glucose levels in animals with type 1 and type 2 diabetes. One study found that when given vanadium, those with diabetes were able to decrease their insulin dosage. Researchers still need to learn how it works in the body to discover any side effects, and to find safe dosages. Cinnamon There have been a couple of studies that seem to indicate cinnamon decreases insulin resistance and increases insulin production. By doing so, it may lower blood glucose. Exact doses are unknown, but it may work best when used in combination with other diabetes medicines.   This information is not intended to replace advice given to you by your health care provider. Make sure you discuss any questions you have with your health care provider.   Document Released: 11/21/2006 Document Revised: 04/18/2011 Document Reviewed: 12/04/2008 Elsevier Interactive Patient Education 2016 [employer].  Diabetes and Exercise Exercising regularly is important. It is not just about losing weight. It has many health benefits, such as:  Improving your overall fitness, flexibility, and endurance.  Increasing your bone density.  Helping with weight control.  Decreasing your body  fat.  Increasing your muscle strength.  Reducing stress and tension.  Improving your overall health. People with diabetes who exercise gain additional benefits because exercise:  Reduces appetite.  Improves the body's use of blood sugar (glucose).  Helps lower or control blood glucose.  Decreases blood pressure.  Helps control blood lipids (such as cholesterol and triglycerides).  Improves the body's use of the hormone insulin by:  Increasing the body's insulin sensitivity.  Reducing the body's insulin needs.  Decreases the risk for heart disease because exercising:  Lowers cholesterol and triglycerides levels.  Increases the levels of good cholesterol (such as high-density lipoproteins [HDL]) in the body.  Lowers blood glucose levels. YOUR ACTIVITY PLAN  Choose an activity that you enjoy, and set realistic goals. To exercise safely, you should begin practicing any new physical activity slowly, and gradually increase the intensity of the exercise over time. Your health care provider or diabetes educator can help create an activity plan that works for you. General recommendations include:  Encouraging children to engage in at least 60 minutes of physical activity each day.  Stretching and performing strength training exercises, such as yoga or weight lifting, at least 2 times per week.  Performing a total of at least 150 minutes of moderate-intensity exercise each week, such as brisk walking or water aerobics.  Exercising at least 3 days per week, making sure you allow no more than 2 consecutive days to pass without exercising.  Avoiding long periods of inactivity (90 minutes or more). When you have to spend an extended period of time sitting down, take frequent breaks to walk or stretch. RECOMMENDATIONS FOR EXERCISING WITH TYPE 1 OR TYPE 2 DIABETES   Check your blood glucose before exercising. If blood glucose levels are greater than 240 mg/dL, check for urine ketones.  Do not exercise if ketones are present.  Avoid injecting insulin into areas of the body that are going to be exercised. For example, avoid injecting insulin into:  The arms when playing tennis.  The legs when jogging.  Keep a record of:  Food intake before and after you exercise.  Expected peak times of insulin action.  Blood glucose levels before and after you exercise.  The type and amount of exercise you have done.  Review your records with your health care provider. Your health care provider will help you to develop guidelines for adjusting food intake and insulin amounts before and after exercising.  If you take insulin or oral hypoglycemic agents, watch for signs and symptoms of hypoglycemia. They include:  Dizziness.  Shaking.  Sweating.  Chills.  Confusion.  Drink plenty of water while you exercise to prevent dehydration or heat stroke. Body water is lost during exercise and must be replaced.  Talk to your health care provider before starting an exercise program to make sure it is safe for you. Remember, almost any type of activity is better than none.   This information is not intended to replace advice given to you by your health care provider. Make sure you discuss any questions you have with your health care provider.   Document Released: 04/16/2003 Document Revised: 06/10/2014 Document Reviewed: 07/03/2012 Elsevier Interactive Patient Education 2016 Elsevier Inc.  Blood Glucose Monitoring, Adult Monitoring your blood glucose (also know as blood sugar) helps you to manage your diabetes. It also helps you and your health care provider monitor your diabetes and determine how well your treatment plan is working. WHY SHOULD YOU MONITOR YOUR BLOOD GLUCOSE?  It can  help you understand how food, exercise, and medicine affect your blood glucose.  It allows you to know what your blood glucose is at any given moment. You can quickly tell if you are having low blood  glucose (hypoglycemia) or high blood glucose (hyperglycemia).  It can help you and your health care provider know how to adjust your medicines.  It can help you understand how to manage an illness or adjust medicine for exercise. WHEN SHOULD YOU TEST? Your health care provider will help you decide how often you should check your blood glucose. This may depend on the type of diabetes you have, your diabetes control, or the types of medicines you are taking. Be sure to write down all of your blood glucose readings so that this information can be reviewed with your health care provider. See below for examples of testing times that your health care provider may suggest. Type 1 Diabetes  Test at least 2 times per day if your diabetes is well controlled, if you are using an insulin pump, or if you perform multiple daily injections.  If your diabetes is not well controlled or if you are sick, you may need to test more often.  It is a good idea to also test:  Before every insulin injection.  Before and after exercise.  Between meals and 2 hours after a meal.  Occasionally between 2:00 a.m. and 3:00 a.m. Type 2 Diabetes  If you are taking insulin, test at least 2 times per day. However, it is best to test before every insulin injection.  If you take medicines by mouth (orally), test 2 times a day.  If you are on a controlled diet, test once a day.  If your diabetes is not well controlled or if you are sick, you may need to monitor more often. HOW TO MONITOR YOUR BLOOD GLUCOSE Supplies Needed  Blood glucose meter.  Test strips for your meter. Each meter has its own strips. You must use the strips that go with your own meter.  A pricking needle (lancet).  A device that holds the lancet (lancing device).  A journal or log book to write down your results. Procedure  Wash your hands with soap and water. Alcohol is not preferred.  Prick the side of your finger (not the tip) with the  lancet.  Gently milk the finger until a small drop of blood appears.  Follow the instructions that come with your meter for inserting the test strip, applying blood to the strip, and using your blood glucose meter. Other Areas to Get Blood for Testing Some meters allow you to use other areas of your body (other than your finger) to test your blood. These areas are called alternative sites. The most common alternative sites are:  The forearm.  The thigh.  The back area of the lower leg.  The palm of the hand. The blood flow in these areas is slower. Therefore, the blood glucose values you get may be delayed, and the numbers are different from what you would get from your fingers. Do not use alternative sites if you think you are having hypoglycemia. Your reading will not be accurate. Always use a finger if you are having hypoglycemia. Also, if you cannot feel your lows (hypoglycemia unawareness), always use your fingers for your blood glucose checks. ADDITIONAL TIPS FOR GLUCOSE MONITORING  Do not reuse lancets.  Always carry your supplies with you.  All blood glucose meters have a 24-hour "hotline" number to call  if you have questions or need help.  Adjust (calibrate) your blood glucose meter with a control solution after finishing a few boxes of strips. BLOOD GLUCOSE RECORD KEEPING It is a good idea to keep a daily record or log of your blood glucose readings. Most glucose meters, if not all, keep your glucose records stored in the meter. Some meters come with the ability to download your records to your home computer. Keeping a record of your blood glucose readings is especially helpful if you are wanting to look for patterns. Make notes to go along with the blood glucose readings because you might forget what happened at that exact time. Keeping good records helps you and your health care provider to work together to achieve good diabetes management.    This information is not intended  to replace advice given to you by your health care provider. Make sure you discuss any questions you have with your health care provider.   Document Released: 01/27/2003 Document Revised: 02/14/2014 Document Reviewed: 06/18/2012 Elsevier Interactive Patient Education [employer]  Please continue taking the metformin 1 pill twice a day. Please take the glipizide 1 pill a day as well. Get a glucometer and check your fingersticks 4 times a day before breakfast and lunch and dinner and before bed. Write down the readings and keep a record of that. Take the record to show to your doctor. [facility] or [facility] in 2 days to check and see how you sugars are doing. Take the Keflex one pill 4 times a day to help treat what looks like a bladder infection. You only need 1 dose of Keflex today. Glipizide we should've given you in the ER so just start that tomorrow. Continue with diet and exercise as I discussed with you earlier. I have put in a consult with the [facility]. They should contact you sometime in the next few days to set up an appointment. If not your doctor can set that up for you. I would also get some Monistat 7 and apply that to your groin where it's feels raw he can apply that twice a day for about a week that should take care of the problem there. Please return here for fever feeling weak or feeling sicker or if your blood sugar remains above 400. Be sure to drink plenty of fluids. Please remember what I talked about symptoms of low blood sugar. Sometimes the glipizide and some of the other diabetic medicines can make her blood sugar go to low. The symptoms of this are sweating jitteriness and rapid heartbeat. If you have those symptoms you need to take some sugar either a sugar packet or hard candy which she can chew up and swallow or some other form a sugar. Do that and then afterwards check your blood sugar. Low  blood sugar can cause serious problems if it is untreated. So always carry some sugar like hard candy or something else with you to take if you ever get those symptoms. I do not believe he will have any trouble with low blood sugar in the near future. At present your blood sugar is too high to worry about this.

## 2015-07-03 ENCOUNTER — Encounter: Payer: BLUE CROSS/BLUE SHIELD | Attending: Family Medicine | Admitting: *Deleted

## 2015-07-03 ENCOUNTER — Encounter: Payer: Self-pay | Admitting: *Deleted

## 2015-07-03 VITALS — BP 132/80 | Ht 70.0 in | Wt 295.7 lb

## 2015-07-03 DIAGNOSIS — E119 Type 2 diabetes mellitus without complications: Secondary | ICD-10-CM | POA: Insufficient documentation

## 2015-07-03 NOTE — Patient Instructions (Addendum)
Check blood sugars 1 x day before breakfast or 2 hrs after supper every day Exercise: Continue walking for 20-30  minutes 5-6 days a week Eat 3 meals day, 2  snacks a day Space meals 4-6 hours apart Bring blood sugar records to the next class Carry fast acting glucose and a snack at all times

## 2015-07-03 NOTE — Progress Notes (Signed)
Diabetes Self-Management Education  Visit Type: First/Initial  Appt. Start Time: 1315 Appt. End Time: 1410  07/03/2015  Ms. Sandra StaplerMekeial Perkins, identified by name and date of birth, is a 40 y.o. female with a diagnosis of Diabetes: Type 2.   ASSESSMENT  Blood pressure 132/80, height 5\' 10"  (1.778 m), weight 295 lb 11.2 oz (134.129 kg), last menstrual period 06/14/2015. Body mass index is 42.43 kg/(m^2).      Diabetes Self-Management Education - 07/03/15 1454    Visit Information   Visit Type First/Initial   Initial Visit   Diabetes Type Type 2   Are you currently following a meal plan? Yes   What type of meal plan do you follow? "I don't drink regular sodas, fruit juices or energy drinks. I cut back on bread and other starches"   Are you taking your medications as prescribed? Yes   Date Diagnosed Jan 2017   Health Coping   How would you rate your overall health? Fair   Psychosocial Assessment   Patient Belief/Attitude about Diabetes Motivated to manage diabetes   Self-care barriers None   Self-management support Doctor's office;Family   Patient Concerns Nutrition/Meal planning;Medication;Problem Solving;Glycemic Control;Weight Control;Healthy Lifestyle   Special Needs None   Preferred Learning Style Auditory;Hands on   Learning Readiness Change in progress   How often do you need to have someone help you when you read instructions, pamphlets, or other written materials from your doctor or pharmacy? 1 - Never   What is the last grade level you completed in school? Associate degree   Pre-Education Assessment   Patient understands the diabetes disease and treatment process. Needs Instruction   Patient understands incorporating nutritional management into lifestyle. Needs Instruction   Patient undertands incorporating physical activity into lifestyle. Needs Review   Patient understands using medications safely. Needs Instruction   Patient understands monitoring blood glucose,  interpreting and using results Needs Instruction   Patient understands prevention, detection, and treatment of acute complications. Needs Instruction   Patient understands prevention, detection, and treatment of chronic complications. Needs Review   Patient understands how to develop strategies to address psychosocial issues. Needs Instruction   Patient understands how to develop strategies to promote health/change behavior. Needs Instruction   Complications   Last HgB A1C per patient/outside source 6.9 %  06/12/15   How often do you check your blood sugar? 1-2 times/day   Fasting Blood glucose range (mg/dL) 74-259;[id number];130-179  Pt reports FBG's 100-170 m/gdL with reading of 132 mg/dL today.    Number of hypoglycemic episodes per month 2 "happens before lunch or in late afternoon - when I skip a snack"   Can you tell when your blood sugar is low? Yes   What do you do if your blood sugar is low? "eat something"   Have you had a dilated eye exam in the past 12 months? Yes   Have you had a dental exam in the past 12 months? No   Are you checking your feet? Yes   How many days per week are you checking your feet? 7   Dietary Intake   Breakfast boiled egg or cereal with milk and fruit or English muffin with [place] bacon or egg   Snack (morning) fruit, crackers, nuts, occasional chips   Lunch tuna, chicken, sandwich   Snack (afternoon) same as morning snack   Dinner chicken, hamburger, occasional pork or steak with green beans, broccolli, greens, corn rice   Beverage(s) water, diet   Exercise   Exercise  Type Light (walking / raking leaves)   How many days per week to you exercise? 5   How many minutes per day do you exercise? 30   Total minutes per week of exercise 150   Patient Education   Previous Diabetes Education Yes (please comment)  Pt had Gestational Diabetes x 2 and received education at [facility]   Disease state  Definition of diabetes, type 1 and 2, and the diagnosis of diabetes;Factors  that contribute to the development of diabetes   Nutrition management  Role of diet in the treatment of diabetes and the relationship between the three main macronutrients and blood glucose level   Physical activity and exercise  Role of exercise on diabetes management, blood pressure control and cardiac health.   Medications Reviewed patients medication for diabetes, action, purpose, timing of dose and side effects.   Monitoring Purpose and frequency of SMBG.;Identified appropriate SMBG and/or A1C goals.   Acute complications Taught treatment of hypoglycemia - the 15 rule.   Chronic complications Relationship between chronic complications and blood glucose control   Psychosocial adjustment Identified and addressed patients feelings and concerns about diabetes   Personal strategies to promote health Review risk of smoking and offered smoking cessation   Individualized Goals (developed by patient)   Reducing Risk Improve blood sugars Decrease medications Prevent diabetes complications Lose weight Lead a healthier lifestyle   Outcomes   Expected Outcomes Demonstrated interest in learning. Expect positive outcomes   Future DMSE 4-6 wks      Individualized Plan for Diabetes Self-Management Training:   Learning Objective:  Patient will have a greater understanding of diabetes self-management. Patient education plan is to attend individual and/or group sessions per assessed needs and concerns.   Plan:   Patient Instructions  Check blood sugars 1 x day before breakfast or 2 hrs after supper every day Exercise: Continue walking for 20-30  minutes 5-6 days a week Eat 3 meals day, 2  snacks a day Space meals 4-6 hours apart Bring blood sugar records to the next class Carry fast acting glucose and a snack at all times   Expected Outcomes:  Demonstrated interest in learning. Expect positive outcomes  Education material provided:  General Meal Planning Guidelines Simple Meal Plan Glucose  tablets Symptoms, causes and treatments of Hypoglycemia  If problems or questions, patient to contact team via:   Sharion Settler, RN, CCM, CDE [phone]  Future DSME appointment: 4-6 wks  August 03, 2015 for Diabetes Class 1

## 2015-08-03 ENCOUNTER — Ambulatory Visit: Payer: BLUE CROSS/BLUE SHIELD

## 2015-08-06 ENCOUNTER — Telehealth: Payer: Self-pay | Admitting: *Deleted

## 2015-08-06 NOTE — Telephone Encounter (Signed)
Pt didn't come to Diabetes Class 1 on Monday August 03, 2015. Attempted to leave a message with phone number listed but was not able to. Sent letter to patient to reschedule classes.

## 2015-08-17 ENCOUNTER — Ambulatory Visit: Payer: BLUE CROSS/BLUE SHIELD

## 2015-08-24 ENCOUNTER — Ambulatory Visit: Payer: BLUE CROSS/BLUE SHIELD

## 2015-08-31 ENCOUNTER — Encounter: Payer: Self-pay | Admitting: *Deleted

## 2015-10-21 ENCOUNTER — Emergency Department: Payer: No Typology Code available for payment source

## 2015-10-21 ENCOUNTER — Encounter: Payer: Self-pay | Admitting: Emergency Medicine

## 2015-10-21 ENCOUNTER — Emergency Department
Admission: EM | Admit: 2015-10-21 | Discharge: 2015-10-21 | Disposition: A | Discharge status: Home / Self Care | Payer: No Typology Code available for payment source | Attending: Emergency Medicine | Admitting: Emergency Medicine

## 2015-10-21 DIAGNOSIS — Y9389 Activity, other specified: Secondary | ICD-10-CM | POA: Diagnosis not present

## 2015-10-21 DIAGNOSIS — S0990XA Unspecified injury of head, initial encounter: Secondary | ICD-10-CM | POA: Diagnosis present

## 2015-10-21 DIAGNOSIS — Z79899 Other long term (current) drug therapy: Secondary | ICD-10-CM | POA: Diagnosis not present

## 2015-10-21 DIAGNOSIS — S20212A Contusion of left front wall of thorax, initial encounter: Secondary | ICD-10-CM | POA: Insufficient documentation

## 2015-10-21 DIAGNOSIS — F17210 Nicotine dependence, cigarettes, uncomplicated: Secondary | ICD-10-CM | POA: Insufficient documentation

## 2015-10-21 DIAGNOSIS — Y999 Unspecified external cause status: Secondary | ICD-10-CM | POA: Diagnosis not present

## 2015-10-21 DIAGNOSIS — S0083XA Contusion of other part of head, initial encounter: Secondary | ICD-10-CM | POA: Diagnosis not present

## 2015-10-21 DIAGNOSIS — Z7984 Long term (current) use of oral hypoglycemic drugs: Secondary | ICD-10-CM | POA: Insufficient documentation

## 2015-10-21 DIAGNOSIS — S0093XA Contusion of unspecified part of head, initial encounter: Secondary | ICD-10-CM

## 2015-10-21 DIAGNOSIS — E119 Type 2 diabetes mellitus without complications: Secondary | ICD-10-CM | POA: Insufficient documentation

## 2015-10-21 DIAGNOSIS — Y92410 Unspecified street and highway as the place of occurrence of the external cause: Secondary | ICD-10-CM | POA: Diagnosis not present

## 2015-10-21 MED ORDER — NAPROXEN 500 MG PO TABS: 500.0000 mg | ORAL_TABLET | Freq: Two times a day (BID) | ORAL | 0 refills | Status: DC

## 2015-10-21 MED ORDER — CYCLOBENZAPRINE HCL 10 MG PO TABS: 10.0000 mg | ORAL_TABLET | Freq: Three times a day (TID) | ORAL | 0 refills | Status: DC | PRN
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
## 2016-06-27 ENCOUNTER — Encounter: Payer: Self-pay | Admitting: Emergency Medicine

## 2016-06-27 ENCOUNTER — Emergency Department: Payer: No Typology Code available for payment source

## 2016-06-27 ENCOUNTER — Emergency Department
Admission: EM | Admit: 2016-06-27 | Discharge: 2016-06-27 | Disposition: A | Discharge status: Home / Self Care | Payer: No Typology Code available for payment source | Attending: Emergency Medicine | Admitting: Emergency Medicine

## 2016-06-27 DIAGNOSIS — E119 Type 2 diabetes mellitus without complications: Secondary | ICD-10-CM | POA: Diagnosis not present

## 2016-06-27 DIAGNOSIS — Z7984 Long term (current) use of oral hypoglycemic drugs: Secondary | ICD-10-CM | POA: Insufficient documentation

## 2016-06-27 DIAGNOSIS — M25461 Effusion, right knee: Secondary | ICD-10-CM

## 2016-06-27 DIAGNOSIS — F17210 Nicotine dependence, cigarettes, uncomplicated: Secondary | ICD-10-CM | POA: Insufficient documentation

## 2016-06-27 DIAGNOSIS — M25561 Pain in right knee: Secondary | ICD-10-CM | POA: Diagnosis present

## 2016-06-27 MED ORDER — HYDROCODONE-ACETAMINOPHEN 5-325 MG PO TABS: 1.0000 | ORAL_TABLET | ORAL | 0 refills | Status: DC | PRN

## 2016-06-27 MED ORDER — ETODOLAC 400 MG PO TABS: 400.0000 mg | ORAL_TABLET | Freq: Two times a day (BID) | ORAL | 0 refills | Status: DC

## 2016-06-27 NOTE — ED Notes (Signed)
First nurse: pt ambulatory to stat desk with reports of right knee pain. NAD.

## 2016-06-27 NOTE — Discharge Instructions (Signed)
[facility] appointment [facility] department in June.   Use knee immobilizer for support. Etodolac 400 mg twice a day with food. Norco as needed for severe pain only when you are at home. Do not attempt to drive while taking this medication. Ice and elevate as needed for swelling.
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
## 2016-07-21 ENCOUNTER — Other Ambulatory Visit: Payer: Self-pay | Admitting: Student

## 2016-07-21 DIAGNOSIS — M25561 Pain in right knee: Secondary | ICD-10-CM

## 2016-07-21 DIAGNOSIS — M25461 Effusion, right knee: Secondary | ICD-10-CM

## 2016-07-27 ENCOUNTER — Ambulatory Visit: Payer: BLUE CROSS/BLUE SHIELD

## 2016-08-29 ENCOUNTER — Encounter: Payer: Self-pay | Admitting: Emergency Medicine

## 2016-08-29 ENCOUNTER — Emergency Department
Admission: EM | Admit: 2016-08-29 | Discharge: 2016-08-29 | Disposition: A | Discharge status: Home / Self Care | Payer: BLUE CROSS/BLUE SHIELD | Attending: Emergency Medicine | Admitting: Emergency Medicine

## 2016-08-29 DIAGNOSIS — Z79899 Other long term (current) drug therapy: Secondary | ICD-10-CM | POA: Insufficient documentation

## 2016-08-29 DIAGNOSIS — F17210 Nicotine dependence, cigarettes, uncomplicated: Secondary | ICD-10-CM | POA: Insufficient documentation

## 2016-08-29 DIAGNOSIS — E119 Type 2 diabetes mellitus without complications: Secondary | ICD-10-CM | POA: Insufficient documentation

## 2016-08-29 DIAGNOSIS — Z7984 Long term (current) use of oral hypoglycemic drugs: Secondary | ICD-10-CM | POA: Insufficient documentation

## 2016-08-29 DIAGNOSIS — M436 Torticollis: Secondary | ICD-10-CM | POA: Insufficient documentation

## 2016-08-29 DIAGNOSIS — J019 Acute sinusitis, unspecified: Secondary | ICD-10-CM

## 2016-08-29 LAB — GLUCOSE, CAPILLARY: GLUCOSE-CAPILLARY: 140 mg/dL — AB (ref 65–99)

## 2016-08-29 MED ORDER — NAPROXEN 500 MG PO TABS: 500.0000 mg | ORAL_TABLET | Freq: Two times a day (BID) | ORAL | 0 refills | Status: AC

## 2016-08-29 MED ORDER — KETOROLAC TROMETHAMINE 60 MG/2ML IM SOLN
30.0000 mg | Freq: Once | INTRAMUSCULAR | Status: AC
  Administered 2016-08-29: 30 mg via INTRAMUSCULAR
  Filled 2016-08-29: qty 2

## 2016-08-29 MED ORDER — PREDNISONE 10 MG (21) PO TBPK: ORAL_TABLET | ORAL | 0 refills | Status: DC

## 2016-08-29 MED ORDER — METHYLPREDNISOLONE SODIUM SUCC 125 MG IJ SOLR
125.0000 mg | Freq: Once | INTRAMUSCULAR | Status: AC
  Administered 2016-08-29: 125 mg via INTRAMUSCULAR
  Filled 2016-08-29: qty 2

## 2016-08-29 NOTE — ED Notes (Signed)
See triage note states she woke up with pain to right side of neck yesterday am  States she took mucinex and [place] with no relief  Denies any trauma or fever

## 2016-08-29 NOTE — ED Triage Notes (Signed)
Pt c/o right sided neck pain since waking up yesterday. Cannot turn head to right due to pain. Can turn to left. NAD. Ambulatory.  Also would like to be seen for congestion and clear sinus drainage.  No fevers.

## 2016-08-29 NOTE — ED Provider Notes (Signed)
[facility] Emergency Department Provider Note  ____________________________________________  Time seen: Approximately 9:09 AM  I have reviewed the triage vital signs and the nursing notes.   HISTORY  Chief Complaint Neck Pain    HPI Sandra Perkins is a 41 y.o. female who presents to emergency department with one day of neck pain and nasal congestion and clear sinus drainage. Patient states that she woke up yesterday morning and the right side of her neck was sore. She thinks that she slept on it funny. She states that the pain feels like a "crook in her neck." Usually this pain resolves more quickly than it has this time. It is painful to turn her head to the right not difficult to turn her head to the left. No difficulty moving her head up and down. She is not having any pain in the back of her neck. Nasal congestion and sinus drainage also started yesterday. She took Mucinex and goody powders for symptoms. No tick or insect bites. She denies fever, headache, confusion, visual changes, photophobia, cough, shortness of breath, chest pain, nausea, vomiting, abdominal pain.   Past Medical History:  Diagnosis Date  . Diabetes mellitus without complication (HCC)     There are no active problems to display for this patient.   Past Surgical History:  Procedure Laterality Date  . TONSILLECTOMY    . TUBAL LIGATION  2002    Prior to Admission medications   Medication Sig Start Date End Date Taking? Authorizing Provider  etodolac (LODINE) 400 MG tablet Take 1 tablet (400 mg total) by mouth 2 (two) times daily. 06/27/16   Tommi Rumps, PA-C  glipiZIDE (GLUCOTROL) 5 MG tablet Take 1 tablet (5 mg total) by mouth daily. 02/25/15 02/25/16  Arnaldo Natal, MD  HYDROcodone-acetaminophen (NORCO/VICODIN) 5-325 MG tablet Take 1 tablet by mouth every 4 (four) hours as needed for moderate pain. 06/27/16   Tommi Rumps, PA-C  metFORMIN (GLUCOPHAGE) 500 MG tablet Take  1 tablet (500 mg total) by mouth 2 (two) times daily with a meal. 02/23/15 02/23/16  Emily Filbert, MD  naproxen (NAPROSYN) 500 MG tablet Take 1 tablet (500 mg total) by mouth 2 (two) times daily with a meal. 08/29/16 08/29/17  Enid Derry, PA-C  predniSONE (STERAPRED UNI-PAK 21 TAB) 10 MG (21) TBPK tablet Take 6 tablets on day 1, take 5 tablets on day 2, take 4 tablets on day 3, take 3 tablets on day 4, take 2 tablets on day 5, take 1 tablet on day 6 08/29/16   Enid Derry, PA-C    Allergies Patient has no known allergies.  Family History  Problem Relation Age of Onset  . Adopted: Yes    Social History Social History  Substance Use Topics  . Smoking status: Current Every Day Smoker    Packs/day: 0.50    Years: 20.00    Types: Cigarettes  . Smokeless tobacco: Never Used     Comment: tried e-cigarette  . Alcohol use 0.0 oz/week     Comment: rarely - special occasions     Review of Systems  Constitutional: No fever/chills Eyes: No visual changes. No discharge. ENT: Positive for congestion and rhinorrhea. Cardiovascular: No chest pain. Respiratory: Negative for cough. No SOB. Gastrointestinal: No abdominal pain.  No nausea, no vomiting.  No diarrhea.  No constipation. Musculoskeletal: Positive for neck pain. Skin: Negative for rash, abrasions, lacerations, ecchymosis. Neurological: Negative for headaches.   ____________________________________________   PHYSICAL EXAM:  VITAL SIGNS:  ED Triage Vitals  Enc Vitals Group     BP 08/29/16 0838 (!) 154/85     Pulse Rate 08/29/16 0838 75     Resp 08/29/16 0838 20     Temp 08/29/16 0838 98.4 F (36.9 C)     Temp Source 08/29/16 0838 Oral     SpO2 08/29/16 0838 100 %     Weight 08/29/16 0838 263 lb (119.3 kg)     Height 08/29/16 0838 5\' 10"  (1.778 m)     Head Circumference --      Peak Flow --      Pain Score 08/29/16 0837 6     Pain Loc --      Pain Edu? --      Excl. in GC? --      Constitutional: Alert  and oriented. Well appearing and in no acute distress. Eyes: Conjunctivae are normal. PERRL. EOMI. No discharge. Head: Atraumatic. ENT: No frontal and maxillary sinus tenderness.      Ears: Tympanic membranes pearly gray with good landmarks. No discharge.      Nose: Mild congestion/rhinnorhea.      Mouth/Throat: Mucous membranes are moist. Oropharynx non-erythematous. Tonsils not enlarged. No exudates. Uvula midline. Neck: No stridor.  No pain with flexion or extension of neck. Pain with right rotation. No pain with left rotation. Tenderness over right trapezius muscle. No cervical spine tenderness. Hematological/Lymphatic/Immunilogical: No cervical lymphadenopathy. Cardiovascular: Normal rate, regular rhythm.  Good peripheral circulation. Respiratory: Normal respiratory effort without tachypnea or retractions. Lungs CTAB. Good air entry to the bases with no decreased or absent breath sounds. Gastrointestinal: Bowel sounds 4 quadrants. Soft and nontender to palpation. No guarding or rigidity. No palpable masses. No distention. Musculoskeletal: Full range of motion to all extremities. No gross deformities appreciated. Neurologic:  Normal speech and language. No gross focal neurologic deficits are appreciated.  Skin:  Skin is warm, dry and intact. No rash noted.   ____________________________________________   LABS (all labs ordered are listed, but only abnormal results are displayed)  Labs Reviewed  GLUCOSE, CAPILLARY - Abnormal; Notable for the following:       Result Value   Glucose-Capillary 140 (*)    All other components within normal limits  CBG MONITORING, ED   ____________________________________________  EKG   ____________________________________________  RADIOLOGY   No results found.  ____________________________________________    PROCEDURES  Procedure(s) performed:    Procedures    Medications  ketorolac (TORADOL) injection 30 mg (30 mg Intramuscular  Given 08/29/16 1028)  methylPREDNISolone sodium succinate (SOLU-MEDROL) 125 mg/2 mL injection 125 mg (125 mg Intramuscular Given 08/29/16 1028)     ____________________________________________   INITIAL IMPRESSION / ASSESSMENT AND PLAN / ED COURSE  Pertinent labs & imaging results that were available during my care of the patient were reviewed by me and considered in my medical decision making (see chart for details).  Review of the [place] CSRS was performed in accordance of the [facility] prior to dispensing any controlled drugs.     Patient presented to the emergency department for evaluation of neck pain and nasal drainage for 1 day. Vital signs and exam are reassuring. Patient has tenderness to palpation over right trapezius muscle and has pain with right rotation of neck. She likely slept wrong on neck. She has also had clear nasal drainage for 1 day. No fevers. She appears well. Patient was given prednisone and Toradol for symptoms. Patient feels comfortable going home. Patient will be discharged home with prescriptions for naproxen  and prednisone. Patient is to follow up with PCP as needed or otherwise directed. Patient is given ED precautions to return to the ED for any worsening or new symptoms.     ____________________________________________  FINAL CLINICAL IMPRESSION(S) / ED DIAGNOSES  Final diagnoses:  Torticollis  Acute sinusitis, recurrence not specified, unspecified location      NEW MEDICATIONS STARTED DURING THIS VISIT:  Discharge Medication List as of 08/29/2016 10:33 AM    START taking these medications   Details  naproxen (NAPROSYN) 500 MG tablet Take 1 tablet (500 mg total) by mouth 2 (two) times daily with a meal., Starting Mon 08/29/2016, Until Tue 08/29/2017, Print    predniSONE (STERAPRED UNI-PAK 21 TAB) 10 MG (21) TBPK tablet Take 6 tablets on day 1, take 5 tablets on day 2, take 4 tablets on day 3, take 3 tablets on day 4, take 2 tablets on day 5, take 1 tablet  on day 6, Print            This chart was dictated using voice recognition software/Dragon. Despite best efforts to proofread, errors can occur which can change the meaning. Any change was purely unintentional.    Enid Derry, PA-C 08/29/16 1059    Arnaldo Natal, MD 08/29/16 1118

## 2018-04-06 ENCOUNTER — Other Ambulatory Visit: Payer: Self-pay

## 2018-04-06 ENCOUNTER — Emergency Department
Admission: EM | Admit: 2018-04-06 | Discharge: 2018-04-06 | Disposition: A | Discharge status: Home / Self Care | Payer: BLUE CROSS/BLUE SHIELD | Attending: Emergency Medicine | Admitting: Emergency Medicine

## 2018-04-06 ENCOUNTER — Encounter: Payer: Self-pay | Admitting: Intensive Care

## 2018-04-06 DIAGNOSIS — Z79899 Other long term (current) drug therapy: Secondary | ICD-10-CM | POA: Insufficient documentation

## 2018-04-06 DIAGNOSIS — J09X2 Influenza due to identified novel influenza A virus with other respiratory manifestations: Secondary | ICD-10-CM | POA: Insufficient documentation

## 2018-04-06 DIAGNOSIS — E119 Type 2 diabetes mellitus without complications: Secondary | ICD-10-CM | POA: Insufficient documentation

## 2018-04-06 DIAGNOSIS — F17210 Nicotine dependence, cigarettes, uncomplicated: Secondary | ICD-10-CM | POA: Insufficient documentation

## 2018-04-06 DIAGNOSIS — J101 Influenza due to other identified influenza virus with other respiratory manifestations: Secondary | ICD-10-CM

## 2018-04-06 LAB — INFLUENZA PANEL BY PCR (TYPE A & B)
Influenza A By PCR: POSITIVE — AB
Influenza B By PCR: NEGATIVE

## 2018-04-06 MED ORDER — OSELTAMIVIR PHOSPHATE 75 MG PO CAPS: 75.0000 mg | ORAL_CAPSULE | Freq: Two times a day (BID) | ORAL | 0 refills | Status: AC

## 2018-04-06 MED ORDER — BENZONATATE 100 MG PO CAPS: 100.0000 mg | ORAL_CAPSULE | Freq: Three times a day (TID) | ORAL | 0 refills | Status: DC | PRN

## 2018-04-06 MED ORDER — IBUPROFEN 600 MG PO TABS: 600.0000 mg | ORAL_TABLET | Freq: Four times a day (QID) | ORAL | 0 refills | Status: DC | PRN

## 2018-04-06 NOTE — ED Triage Notes (Signed)
Patient c/o flu like symptoms of body aches, chills/sweats, headache, and "not feeling good" Patient reports taking mucinex last night with no relief

## 2018-04-06 NOTE — ED Provider Notes (Signed)
[facility] Emergency Department Provider Note  ____________________________________________  Time seen: Approximately 8:37 AM  I have reviewed the triage vital signs and the nursing notes.   HISTORY  Chief Complaint Influenza    HPI Sandra Perkins is a 43 y.o. female that  presents emergency department for evaluation of 1 day of fever, headache, body aches, nonproductive cough.  No sick contacts.  Patient smokes a couple of cigarettes per day.  No vomiting, diarrhea.  Past Medical History:  Diagnosis Date  . Diabetes mellitus without complication (HCC)     There are no active problems to display for this patient.   Past Surgical History:  Procedure Laterality Date  . TONSILLECTOMY    . TUBAL LIGATION  2002    Prior to Admission medications   Medication Sig Start Date End Date Taking? Authorizing Provider  benzonatate (TESSALON PERLES) 100 MG capsule Take 1 capsule (100 mg total) by mouth 3 (three) times daily as needed for cough. 04/06/18 04/06/19  Enid Derry, PA-C  etodolac (LODINE) 400 MG tablet Take 1 tablet (400 mg total) by mouth 2 (two) times daily. 06/27/16   Tommi Rumps, PA-C  glipiZIDE (GLUCOTROL) 5 MG tablet Take 1 tablet (5 mg total) by mouth daily. 02/25/15 02/25/16  Arnaldo Natal, MD  HYDROcodone-acetaminophen (NORCO/VICODIN) 5-325 MG tablet Take 1 tablet by mouth every 4 (four) hours as needed for moderate pain. 06/27/16   Tommi Rumps, PA-C  ibuprofen (ADVIL,MOTRIN) 600 MG tablet Take 1 tablet (600 mg total) by mouth every 6 (six) hours as needed. 04/06/18   Enid Derry, PA-C  metFORMIN (GLUCOPHAGE) 500 MG tablet Take 1 tablet (500 mg total) by mouth 2 (two) times daily with a meal. 02/23/15 02/23/16  Emily Filbert, MD  oseltamivir (TAMIFLU) 75 MG capsule Take 1 capsule (75 mg total) by mouth 2 (two) times daily for 5 days. 04/06/18 04/11/18  Enid Derry, PA-C  predniSONE (STERAPRED UNI-PAK 21 TAB) 10 MG (21) TBPK  tablet Take 6 tablets on day 1, take 5 tablets on day 2, take 4 tablets on day 3, take 3 tablets on day 4, take 2 tablets on day 5, take 1 tablet on day 6 08/29/16   Enid Derry, PA-C    Allergies Patient has no known allergies.  Family History  Adopted: Yes    Social History Social History   Tobacco Use  . Smoking status: Current Every Day Smoker    Packs/day: 0.50    Years: 20.00    Pack years: 10.00    Types: Cigarettes  . Smokeless tobacco: Never Used  . Tobacco comment: tried e-cigarette  Substance Use Topics  . Alcohol use: Yes    Alcohol/week: 0.0 standard drinks    Comment: rarely - special occasions  . Drug use: No     Review of Systems  Constitutional: Positive for fever. Eyes: No visual changes. No discharge. ENT: Positive for congestion and rhinorrhea. Cardiovascular: No chest pain. Respiratory: Positive for cough. No SOB. Gastrointestinal: No abdominal pain.  No nausea, no vomiting.  No diarrhea.  No constipation. Musculoskeletal: Positive for body aches. Skin: Negative for rash, abrasions, lacerations, ecchymosis. Neurological: Positive for headache.   ____________________________________________   PHYSICAL EXAM:  VITAL SIGNS: ED Triage Vitals [04/06/18 0744]  Enc Vitals Group     BP (!) 161/73     Pulse Rate 84     Resp 16     Temp 99.4 F (37.4 C)     Temp Source  Oral     SpO2 94 %     Weight 275 lb (124.7 kg)     Height  (1.778 m)     Head Circumference      Peak Flow      Pain Score 8     Pain Loc      Pain Edu?      Excl. in GC?      Constitutional: Alert and oriented. Well appearing and in no acute distress. Eyes: Conjunctivae are normal. PERRL. EOMI. No discharge. Head: Atraumatic. ENT: No frontal and maxillary sinus tenderness.      Ears: Tympanic membranes pearly gray with good landmarks. No discharge.      Nose: No congestion/rhinnorhea.      Mouth/Throat: Mucous membranes are moist. Oropharynx non-erythematous.  Tonsils not enlarged. No exudates. Uvula midline. Neck: No stridor.   Hematological/Lymphatic/Immunilogical: No cervical lymphadenopathy. Cardiovascular: Normal rate, regular rhythm.  Good peripheral circulation. Respiratory: Normal respiratory effort without tachypnea or retractions. Lungs CTAB. Good air entry to the bases with no decreased or absent breath sounds. Gastrointestinal: Bowel sounds 4 quadrants. Soft and nontender to palpation. No guarding or rigidity. No palpable masses. No distention. Musculoskeletal: Full range of motion to all extremities. No gross deformities appreciated. Neurologic:  Normal speech and language. No gross focal neurologic deficits are appreciated.  Skin:  Skin is warm, dry and intact. No rash noted. Psychiatric: Mood and affect are normal. Speech and behavior are normal. Patient exhibits appropriate insight and judgement.   ____________________________________________   LABS (all labs ordered are listed, but only abnormal results are displayed)  Labs Reviewed  INFLUENZA PANEL BY PCR (TYPE A & B) - Abnormal; Notable for the following components:      Result Value   Influenza A By PCR POSITIVE (*)    All other components within normal limits   ____________________________________________  EKG   ____________________________________________  RADIOLOGY   No results found.  ____________________________________________    PROCEDURES  Procedure(s) performed:    Procedures    Medications - No data to display   ____________________________________________   INITIAL IMPRESSION / ASSESSMENT AND PLAN / ED COURSE  Pertinent labs & imaging results that were available during my care of the patient were reviewed by me and considered in my medical decision making (see chart for details).  Review of the [place] CSRS was performed in accordance of the [facility] prior to dispensing any controlled drugs.  Patient's diagnosis is consistent with  influenza A. Vital signs and exam are reassuring. Patient appears well and is staying well hydrated. Patient should alternate tylenol and ibuprofen for fever. Patient feels comfortable going home. Patient will be discharged home with prescriptions for Tamiflu, Tessalon Perles, ibuprofen. Patient is to follow up with primary care as needed or otherwise directed. Patient is given ED precautions to return to the ED for any worsening or new symptoms.     ____________________________________________  FINAL CLINICAL IMPRESSION(S) / ED DIAGNOSES  Final diagnoses:  Influenza A      NEW MEDICATIONS STARTED DURING THIS VISIT:  ED Discharge Orders         Ordered    oseltamivir (TAMIFLU) 75 MG capsule  2 times daily     04/06/18 0920    benzonatate (TESSALON PERLES) 100 MG capsule  3 times daily PRN     04/06/18 0920    ibuprofen (ADVIL,MOTRIN) 600 MG tablet  Every 6 hours PRN     04/06/18 0920  This chart was dictated using voice recognition software/Dragon. Despite best efforts to proofread, errors can occur which can change the meaning. Any change was purely unintentional.    Enid Derry, PA-C 04/06/18 1131    Arnaldo Natal, MD 04/06/18 1721

## 2018-04-06 NOTE — ED Notes (Signed)

## 2018-08-29 ENCOUNTER — Emergency Department
Admission: EM | Admit: 2018-08-29 | Discharge: 2018-08-29 | Disposition: A | Discharge status: Home / Self Care | Payer: Medicaid Other | Attending: Emergency Medicine | Admitting: Emergency Medicine

## 2018-08-29 ENCOUNTER — Other Ambulatory Visit: Payer: Self-pay

## 2018-08-29 ENCOUNTER — Emergency Department: Payer: Medicaid Other

## 2018-08-29 DIAGNOSIS — E119 Type 2 diabetes mellitus without complications: Secondary | ICD-10-CM | POA: Insufficient documentation

## 2018-08-29 DIAGNOSIS — Z7984 Long term (current) use of oral hypoglycemic drugs: Secondary | ICD-10-CM | POA: Insufficient documentation

## 2018-08-29 DIAGNOSIS — M160 Bilateral primary osteoarthritis of hip: Secondary | ICD-10-CM | POA: Insufficient documentation

## 2018-08-29 DIAGNOSIS — F17210 Nicotine dependence, cigarettes, uncomplicated: Secondary | ICD-10-CM | POA: Insufficient documentation

## 2018-08-29 MED ORDER — METHYLPREDNISOLONE 4 MG PO TBPK: ORAL_TABLET | ORAL | 0 refills | Status: DC

## 2018-08-29 MED ORDER — GLUCOSE BLOOD VI STRP: ORAL_STRIP | 12 refills | Status: AC
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
## 2018-08-29 NOTE — ED Triage Notes (Signed)
Pt c/o right hip and upper leg pain for the past 2 weeks. Denies injury

## 2018-08-29 NOTE — Discharge Instructions (Signed)
Discontinue meloxicam for 1 week.  Monitor glucose while taking steroids.
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
## 2020-07-27 ENCOUNTER — Encounter: Payer: Self-pay | Admitting: Emergency Medicine

## 2020-07-27 ENCOUNTER — Emergency Department: Payer: Self-pay

## 2020-07-27 ENCOUNTER — Other Ambulatory Visit: Payer: Self-pay

## 2020-07-27 ENCOUNTER — Emergency Department
Admission: EM | Admit: 2020-07-27 | Discharge: 2020-07-27 | Disposition: A | Discharge status: Home / Self Care | Payer: Self-pay | Attending: Emergency Medicine | Admitting: Emergency Medicine

## 2020-07-27 DIAGNOSIS — E119 Type 2 diabetes mellitus without complications: Secondary | ICD-10-CM | POA: Insufficient documentation

## 2020-07-27 DIAGNOSIS — M25562 Pain in left knee: Secondary | ICD-10-CM

## 2020-07-27 DIAGNOSIS — M779 Enthesopathy, unspecified: Secondary | ICD-10-CM | POA: Insufficient documentation

## 2020-07-27 DIAGNOSIS — F17210 Nicotine dependence, cigarettes, uncomplicated: Secondary | ICD-10-CM | POA: Insufficient documentation

## 2020-07-27 DIAGNOSIS — M1712 Unilateral primary osteoarthritis, left knee: Secondary | ICD-10-CM | POA: Insufficient documentation

## 2020-07-27 DIAGNOSIS — M76899 Other specified enthesopathies of unspecified lower limb, excluding foot: Secondary | ICD-10-CM

## 2020-07-27 MED ORDER — ACETAMINOPHEN 325 MG PO TABS
650.0000 mg | ORAL_TABLET | Freq: Once | ORAL | Status: AC
  Administered 2020-07-27: 650 mg via ORAL
  Filled 2020-07-27: qty 2

## 2020-07-27 MED ORDER — MELOXICAM 15 MG PO TABS: 15.0000 mg | ORAL_TABLET | Freq: Every day | ORAL | 0 refills | Status: AC

## 2020-07-27 MED ORDER — MELOXICAM 7.5 MG PO TABS
15.0000 mg | ORAL_TABLET | Freq: Once | ORAL | Status: AC
  Administered 2020-07-27: 15 mg via ORAL
  Filled 2020-07-27: qty 2

## 2020-07-27 MED ORDER — METHOCARBAMOL 750 MG PO TABS: 750.0000 mg | ORAL_TABLET | Freq: Four times a day (QID) | ORAL | 0 refills | Status: AC | PRN

## 2020-07-27 MED ORDER — METHOCARBAMOL 500 MG PO TABS
750.0000 mg | ORAL_TABLET | Freq: Once | ORAL | Status: AC
  Administered 2020-07-27: 750 mg via ORAL
  Filled 2020-07-27: qty 2
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
## 2020-07-27 NOTE — ED Notes (Signed)
See triage note   Presents with left knee pain for couple of days   Denies any new injury
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
## 2020-07-27 NOTE — Discharge Instructions (Addendum)
Take the mobic once daily as prescribed. Use Robaxin every 6 hours as needed. Take Tylenol up to 1000mg  every 6 hours as needed. Follow up with orthopedics regarding your left knee pain.

## 2021-06-03 ENCOUNTER — Other Ambulatory Visit: Payer: Self-pay

## 2021-06-03 ENCOUNTER — Emergency Department
Admission: EM | Admit: 2021-06-03 | Discharge: 2021-06-03 | Disposition: A | Discharge status: Home / Self Care | Payer: 59 | Attending: Emergency Medicine | Admitting: Emergency Medicine

## 2021-06-03 ENCOUNTER — Encounter: Payer: Self-pay | Admitting: Emergency Medicine

## 2021-06-03 ENCOUNTER — Emergency Department: Payer: 59

## 2021-06-03 DIAGNOSIS — E119 Type 2 diabetes mellitus without complications: Secondary | ICD-10-CM | POA: Insufficient documentation

## 2021-06-03 DIAGNOSIS — W010XXA Fall on same level from slipping, tripping and stumbling without subsequent striking against object, initial encounter: Secondary | ICD-10-CM | POA: Insufficient documentation

## 2021-06-03 DIAGNOSIS — M79661 Pain in right lower leg: Secondary | ICD-10-CM | POA: Insufficient documentation

## 2021-06-03 DIAGNOSIS — M79671 Pain in right foot: Secondary | ICD-10-CM | POA: Insufficient documentation

## 2021-06-03 DIAGNOSIS — Y990 Civilian activity done for income or pay: Secondary | ICD-10-CM | POA: Diagnosis not present

## 2021-06-03 DIAGNOSIS — I10 Essential (primary) hypertension: Secondary | ICD-10-CM | POA: Insufficient documentation

## 2021-06-03 MED ORDER — ACETAMINOPHEN 500 MG PO TABS
1000.0000 mg | ORAL_TABLET | Freq: Once | ORAL | Status: AC
  Administered 2021-06-03: 1000 mg via ORAL
  Filled 2021-06-03: qty 2

## 2021-06-03 NOTE — Discharge Instructions (Addendum)
-  Take Tylenol/ibuprofen as needed for pain. ?-Return to the emergency department anytime if you begin to experience any new or worsening symptoms. ?-Follow-up with your primary care provider, as discussed, in regards to your blood pressure. ?

## 2021-06-03 NOTE — ED Triage Notes (Signed)
Pt comes into the ED via POV c/o right foot and leg pain.  Pt states that she tripped and caught herself before falling, but she woke up this morning with right foot and leg pain.  Pt in NAD at this time with even and unlabored respirations.   ?

## 2021-06-03 NOTE — ED Provider Notes (Signed)
? ?[facility] ?Provider Note ? ? ? Event Date/Time  ? First MD Initiated Contact with Patient 06/03/21 0900   ?  (approximate) ? ? ?History  ? ?Chief Complaint ?Foot Pain ? ? ?HPI ?Sandra Perkins is a 46 y.o. female, history of diabetes, presents emergency department for evaluation of foot/leg pain.  Patient states that she was walking at work yesterday when she accidentally got her foot caught on a industrial cord, causing her to fall.  She states that she is unsure of the positioning of her leg when she fell.  Currently endorsing pain along the right foot, particularly the heel, as well as the lateral aspect of the lower leg.  She states that she is still able to walk without assistance, though it is uncomfortable.  Denies any other injuries or recent illnesses.  Denies knee pain, thigh pain, hip pain, numbness/tingling in lower extremity, cold sensation, fever/chills, chest pain, shortness of breath, headache, or rashes/lesions. ? ?History Limitations: No limitations ? ?    ? ? ?Physical Exam  ?Triage Vital Signs: ?ED Triage Vitals [06/03/21 0856]  ?Enc Vitals Group  ?   BP   ?   Pulse   ?   Resp   ?   Temp   ?   Temp src   ?   SpO2   ?   Weight 271 lb 2.7 oz (123 kg)  ?   Height 5\' 10"  (1.778 m)  ?   Head Circumference   ?   Peak Flow   ?   Pain Score 10  ?   Pain Loc   ?   Pain Edu?   ?   Excl. in GC?   ? ? ?Most recent vital signs: ?Vitals:  ? 06/03/21 0904  ?BP: (!) 171/82  ?Pulse: 74  ?Resp: 16  ?Temp: 98.2 ?F (36.8 ?C)  ?SpO2: 98%  ? ? ?General: Awake, NAD.  ?Skin: Warm, dry. No rashes or lesions.  ?Eyes: PERRL. Conjunctivae normal.  ?CV: Good peripheral perfusion.  ?Resp: Normal effort.  ?Abd: Soft, non-tender. No distention.  ?Neuro: At baseline. No gross neurological deficits.  ? ?Focused Exam: No gross deformity to the right lower extremity.  Mild tenderness when palpating the lateral aspect of the right lower leg, particular along the midshaft region of the tibia/fibula.  No  surrounding warmth or erythema. ? ?No gross deformities to the right foot.  Tenderness when palpating the medial calcaneus.  Patient maintains normal range of motion, including flexion and extension of the foot.  No remarkable swelling, erythema, or warmth.  Pulses intact distally. ? ?Physical Exam ? ? ? ?ED Results / Procedures / Treatments  ?Labs ?(all labs ordered are listed, but only abnormal results are displayed) ?Labs Reviewed - No data to display ? ? ?EKG ?Not applicable ? ? ?RADIOLOGY ? ?ED Provider Interpretation: I personally reviewed and interpreted these images, heel spur present on the right foot, otherwise no evidence of fractures or dislocations ? ?DG Tibia/Fibula Right ? ?Result Date: 06/03/2021 ?CLINICAL DATA:  Right leg and foot pain. Most pain around heel and Achilles area. Tripped and caught herself before falling. The woke up this morning with right foot and leg pain. EXAM: RIGHT TIBIA AND FIBULA - 2 VIEW COMPARISON:  Right knee radiographs 06/27/2016 FINDINGS: Moderate lateral compartment joint space narrowing and peripheral osteophytosis. Moderate superior patellar degenerative osteophytosis. Tiny joint effusion. Minimal anterior and posterior distal tibial plafond degenerative spurring. The ankle mortise is symmetric and intact.  Mild-to-moderate chronic enthesopathic change at the Achilles insertion on the calcaneus. 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 This has progressed from 06/27/2016. Mild-to-moderate chronic enthesopathic spurring at the Achilles insertion on the calcaneus. Electronically Signed   By: Neita Garnet M.D.   On: 06/03/2021 09:26  ? ?DG Foot Complete Right ? ?Result Date: 06/03/2021 ?CLINICAL DATA:  Left lateral leg pain EXAM: RIGHT FOOT COMPLETE - 3+ VIEW COMPARISON:  None. FINDINGS: No acute fracture or dislocation. No aggressive osseous lesion. Normal alignment. Enthesopathic changes at the Achilles  tendon insertion. Small plantar calcaneal spur. Soft tissue are unremarkable. No radiopaque foreign body or soft tissue emphysema. IMPRESSION: 1. No acute osseous injury of the right foot. Electronically Signed   By: Elige Ko M.D.   On: 06/03/2021 09:24   ? ?PROCEDURES: ? ?Critical Care performed: None ? ?Procedures ? ? ? ?MEDICATIONS ORDERED IN ED: ?Medications  ?acetaminophen (TYLENOL) tablet 1,000 mg (1,000 mg Oral Given 06/03/21 0915)  ? ? ? ?IMPRESSION / MDM / ASSESSMENT AND PLAN / ED COURSE  ?I reviewed the triage vital signs and the nursing notes. ?             ?               ? ?Differential diagnosis includes, but is not limited to, ankle sprain, calcaneal fracture, tibia fracture, fibula fracture, foot sprain ? ?ED Course ?Patient appears well, notably hypertensive at 171/82, otherwise normal vitals.  NAD.  Endorsing mild pain at this time.  We will go ahead treat with 1000 mg acetaminophen. ? ?Assessment/Plan ?Patient presents with foot/leg pain in the right lower extremity following mechanical fall.  X-ray reassuring for no evidence of fractures or dislocations.  Physical exam is unimpressive.  Low suspicion for any occult injuries requiring advanced imaging.  Patient does have a small calcaneal spur and Achilles heel enthesopathy, which may be the source of the patient's pain, though unlikely to be a result of her mechanical fall.  She is still able to ambulate on her own without assistance.  We will plan to discharge.  Advised her to ice, rest, and take Tylenol/ibuprofen as needed. ? ?Provided the patient with anticipatory guidance, return precautions, and educational material. Encouraged the patient to return to the emergency department at any time if they begin to experience any new or worsening symptoms. Patient expressed understanding and agreed with the plan.  ? ?  ? ? ?FINAL CLINICAL IMPRESSION(S) / ED DIAGNOSES  ? ?Final diagnoses:  ?Foot pain, right  ? ? ? ?Rx / DC Orders  ? ?ED Discharge  Orders   ? ? None  ? ?  ? ? ? ?Note:  This document was prepared using Dragon voice recognition software and may include unintentional dictation errors. ?  ?Varney Daily, [place] ?06/03/21 1010 ? ?  ?Chesley Noon, MD ?06/03/21 1428 ? ?

## 2021-06-03 NOTE — ED Notes (Signed)
See triage note  presents with pain to right foot/lower leg  states she tripped yesterday   states she did not fall  she caught herself

## 2022-11-05 IMAGING — DX DG FOOT COMPLETE 3+V*R*
3 series · 3 of 3 positions shown · non-contrast
Comparison: None.

CLINICAL DATA: Left lateral leg pain

EXAM:
RIGHT FOOT COMPLETE - 3+ VIEW

[foot ap]
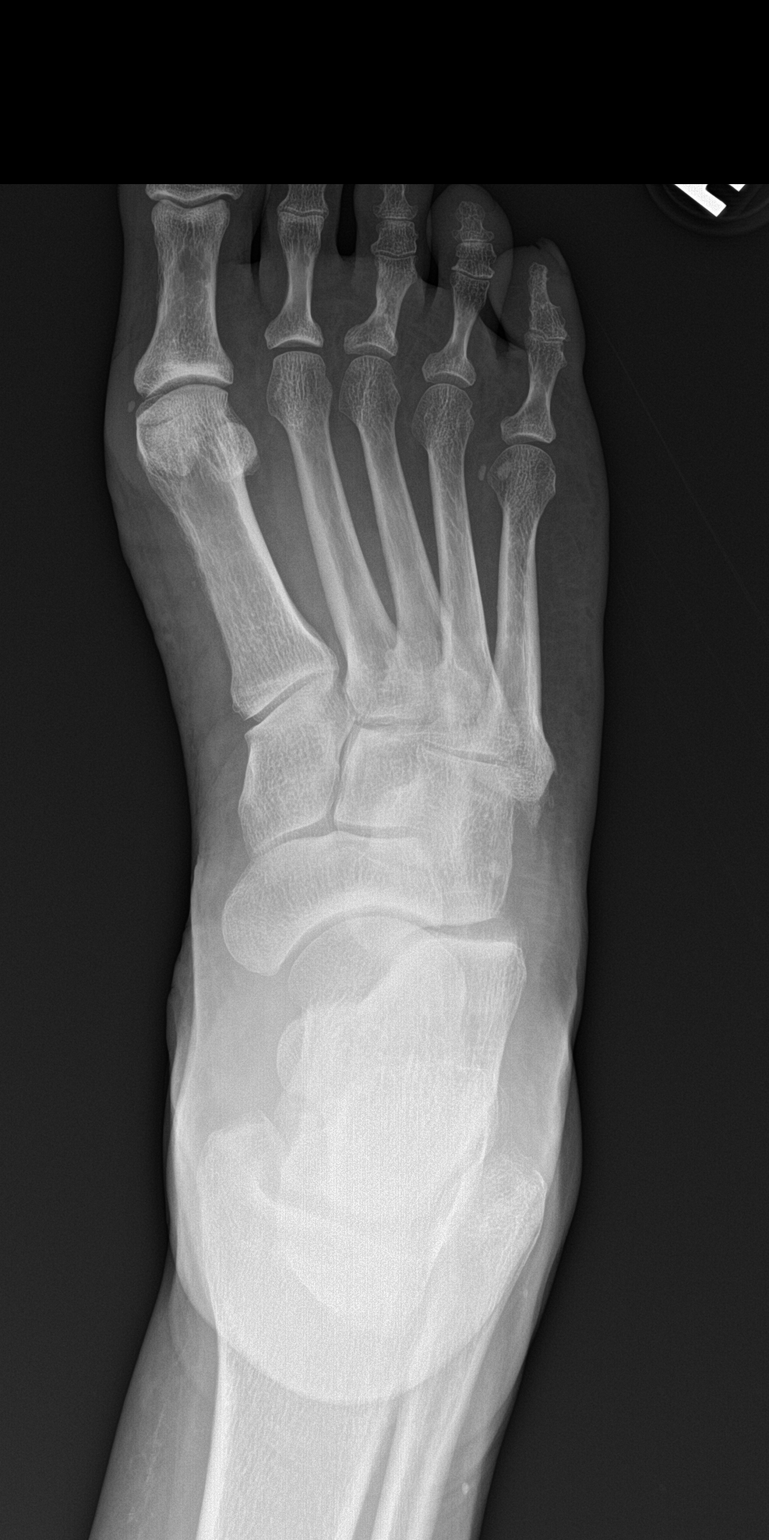

[foot obl]
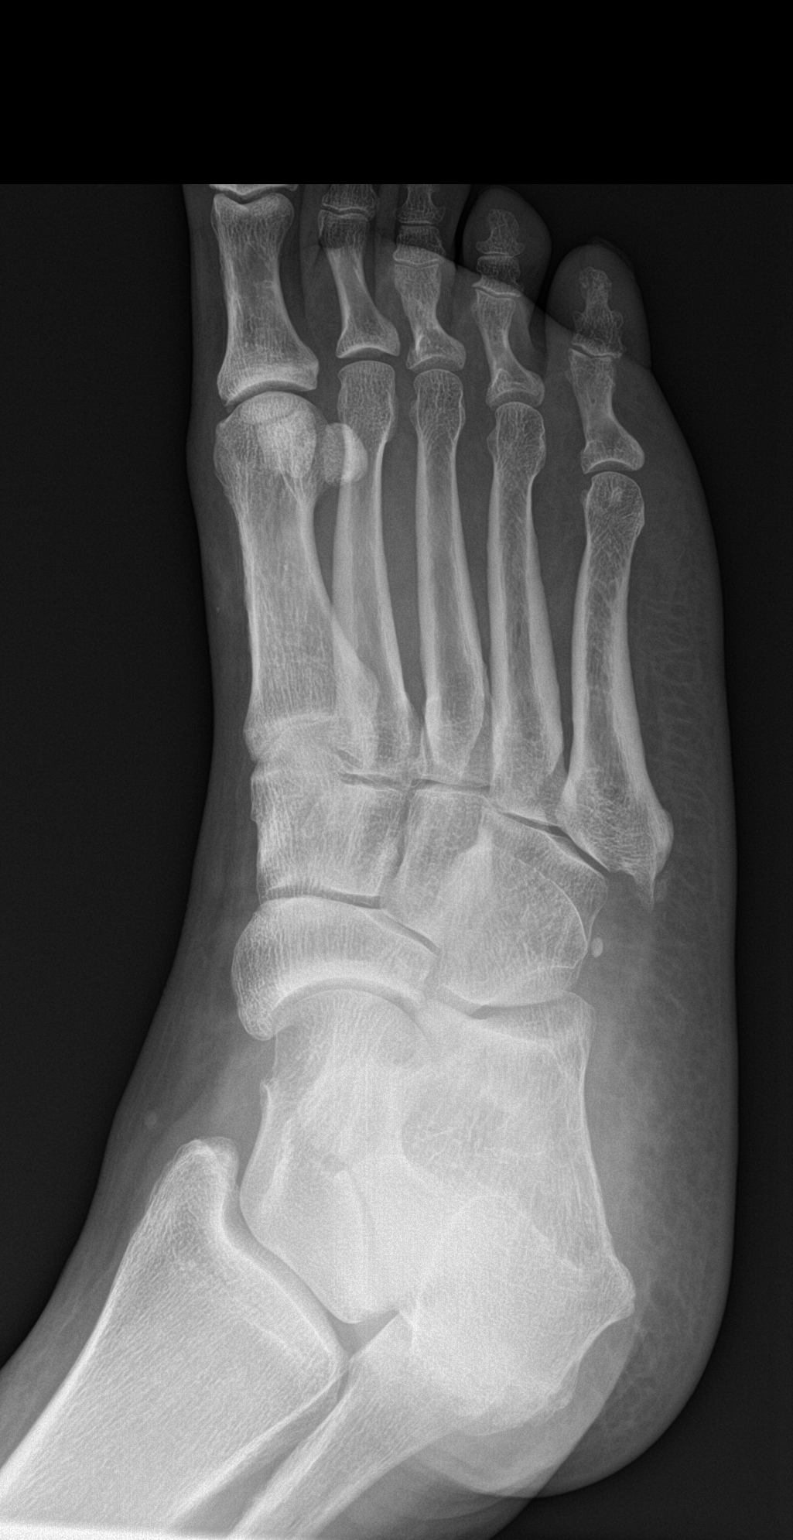

[foot lat]
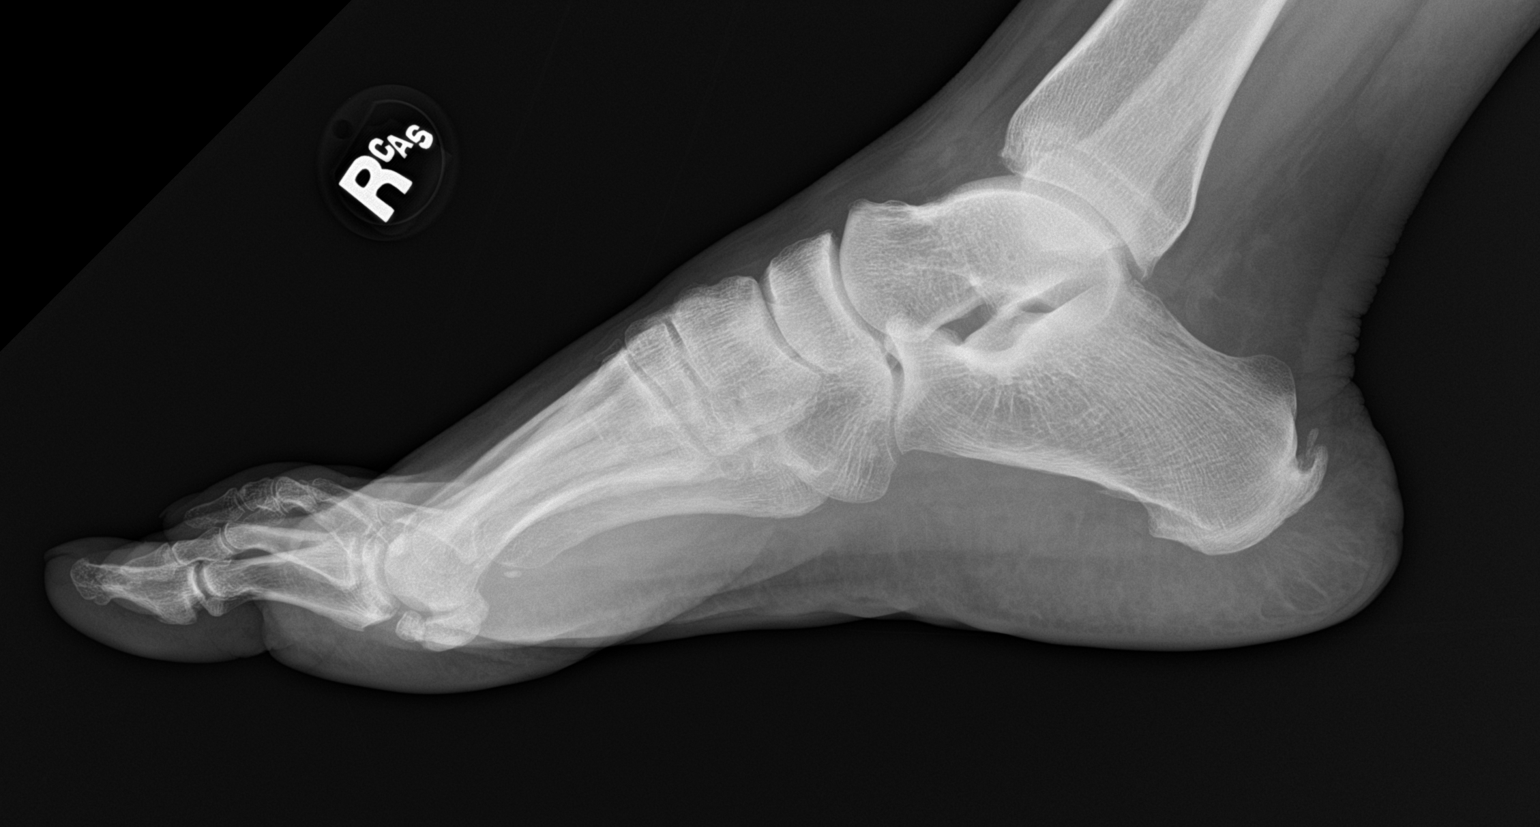

[3 of 3 positions shown; findings below may reference images not displayed]

FINDINGS: No acute fracture or dislocation. No aggressive osseous lesion.
Normal alignment. Enthesopathic changes at the Achilles tendon
insertion. Small plantar calcaneal spur.

Soft tissue are unremarkable. No radiopaque foreign body or soft
tissue emphysema.
IMPRESSION: 1. No acute osseous injury of the right foot.
